# Patient Record
Sex: Male | Born: 1975 | Race: White | Hispanic: No | Smoking: Never smoker
Health system: Southern US, Community
[De-identification: ages and names within clinical notes are randomized; demographics above are authoritative.]

## PROBLEM LIST (undated history)

## (undated) DIAGNOSIS — N289 Disorder of kidney and ureter, unspecified: Secondary | ICD-10-CM

## (undated) DIAGNOSIS — Z87442 Personal history of urinary calculi: Secondary | ICD-10-CM

## (undated) HISTORY — PX: NO PAST SURGERIES: SHX2092

## (undated) HISTORY — PX: LITHOTRIPSY: SUR834

---

## 2001-10-08 ENCOUNTER — Encounter: Payer: Self-pay | Admitting: Emergency Medicine

## 2001-10-08 ENCOUNTER — Emergency Department (HOSPITAL_COMMUNITY): Admission: EM | Admit: 2001-10-08 | Discharge: 2001-10-08 | Payer: Self-pay | Admitting: Emergency Medicine

## 2005-10-26 ENCOUNTER — Emergency Department (HOSPITAL_COMMUNITY): Admission: EM | Admit: 2005-10-26 | Discharge: 2005-10-26 | Payer: Self-pay | Admitting: Emergency Medicine

## 2005-11-05 ENCOUNTER — Encounter: Payer: Self-pay | Admitting: Urology

## 2005-11-05 ENCOUNTER — Ambulatory Visit (HOSPITAL_COMMUNITY): Admission: RE | Admit: 2005-11-05 | Discharge: 2005-11-05 | Payer: Self-pay | Admitting: Urology

## 2013-10-15 ENCOUNTER — Emergency Department (HOSPITAL_COMMUNITY): Payer: BC Managed Care – PPO

## 2013-10-15 ENCOUNTER — Emergency Department (HOSPITAL_COMMUNITY)
Admission: EM | Admit: 2013-10-15 | Discharge: 2013-10-16 | Disposition: A | Discharge status: Home / Self Care | Payer: BC Managed Care – PPO | Attending: Emergency Medicine | Admitting: Emergency Medicine

## 2013-10-15 ENCOUNTER — Encounter (HOSPITAL_COMMUNITY): Payer: Self-pay | Admitting: Emergency Medicine

## 2013-10-15 DIAGNOSIS — N200 Calculus of kidney: Secondary | ICD-10-CM | POA: Diagnosis not present

## 2013-10-15 DIAGNOSIS — R109 Unspecified abdominal pain: Secondary | ICD-10-CM | POA: Diagnosis present

## 2013-10-15 LAB — URINALYSIS, ROUTINE W REFLEX MICROSCOPIC
BILIRUBIN URINE: NEGATIVE
Glucose, UA: NEGATIVE mg/dL
Ketones, ur: NEGATIVE mg/dL
Nitrite: NEGATIVE
Protein, ur: NEGATIVE mg/dL
SPECIFIC GRAVITY, URINE: 1.021 (ref 1.005–1.030)
Urobilinogen, UA: 0.2 mg/dL (ref 0.0–1.0)
pH: 6 (ref 5.0–8.0)

## 2013-10-15 LAB — URINE MICROSCOPIC-ADD ON

## 2013-10-15 MED ORDER — SODIUM CHLORIDE 0.9 % IV SOLN
INTRAVENOUS | Status: DC
  Administered 2013-10-15: 20 mL/h via INTRAVENOUS

## 2013-10-15 MED ORDER — HYDROMORPHONE HCL PF 1 MG/ML IJ SOLN
2.0000 mg | Freq: Once | INTRAMUSCULAR | Status: DC
  Filled 2013-10-15: qty 2

## 2013-10-15 MED ORDER — ONDANSETRON HCL 4 MG/2ML IJ SOLN
4.0000 mg | Freq: Once | INTRAMUSCULAR | Status: DC
  Filled 2013-10-15: qty 2

## 2013-10-15 NOTE — ED Notes (Signed)
Pt. Refused ordered pain med Diluadid IV and Zofran,claimed that he took his pain med prior to coming to ED. Pt. Stated that he "just needs an Xray for his sides." MD notified.

## 2013-10-15 NOTE — ED Provider Notes (Signed)
CSN: [id number]     Arrival date & time 10/15/13  1951 History   First MD Initiated Contact with Patient 10/15/13 2040     Chief Complaint  Patient presents with  . Flank Pain     (Consider location/radiation/quality/duration/timing/severity/associated sxs/prior Treatment) HPI Comments: Patient here with acute onset of left-sided flank pain similar to his prior renal colic. Notes pain in his left flank radiating down to his groin without scrotal edema. No hematuria. No fever or chills. Has used opiates without relief. Last episode of kidney stone was approximately 2 years ago. Symptoms persistent and nothing makes them better worse.  Patient is a 38 y.o. male presenting with flank pain. The history is provided by the patient and the spouse.  Flank Pain    History reviewed. No pertinent past medical history. History reviewed. No pertinent past surgical history. History reviewed. No pertinent family history. History  Substance Use Topics  . Smoking status: Never Smoker   . Smokeless tobacco: Not on file  . Alcohol Use: No    Review of Systems  Genitourinary: Positive for flank pain.  All other systems reviewed and are negative.     Allergies  Review of patient's allergies indicates no known allergies.  Home Medications   Prior to Admission medications   Not on File   BP 143/69  Pulse 84  Resp 16  Ht  (1.676 m)  Wt 230 lb (104.327 kg)  BMI 37.14 kg/m2  SpO2 97% Physical Exam  Nursing note and vitals reviewed. Constitutional: He is oriented to person, place, and time. He appears well-developed and well-nourished.  Non-toxic appearance. No distress.  HENT:  Head: Normocephalic and atraumatic.  Eyes: Conjunctivae, EOM and lids are normal. Pupils are equal, round, and reactive to light.  Neck: Normal range of motion. Neck supple. No tracheal deviation present. No mass present.  Cardiovascular: Normal rate, regular rhythm and normal heart sounds.  Exam reveals no  gallop.   No murmur heard. Pulmonary/Chest: Effort normal and breath sounds normal. No stridor. No respiratory distress. He has no decreased breath sounds. He has no wheezes. He has no rhonchi. He has no rales.  Abdominal: Soft. Normal appearance and bowel sounds are normal. He exhibits no distension. There is no tenderness. There is no rebound and no CVA tenderness.  Musculoskeletal: Normal range of motion. He exhibits no edema and no tenderness.  Neurological: He is alert and oriented to person, place, and time. He has normal strength. No cranial nerve deficit or sensory deficit. GCS eye subscore is 4. GCS verbal subscore is 5. GCS motor subscore is 6.  Skin: Skin is warm and dry. No abrasion and no rash noted.  Psychiatric: He has a normal mood and affect. His speech is normal and behavior is normal.    ED Course  Procedures (including critical care time) Labs Review Labs Reviewed  URINALYSIS, ROUTINE W REFLEX MICROSCOPIC    Imaging Review No results found.   EKG Interpretation None      MDM   Final diagnoses:  None    Patient offered pain medication here and he has deferred. CT results given to patient. Will give urology referral    Toy Baker, MD 10/15/13 2342

## 2013-10-15 NOTE — Discharge Instructions (Signed)

## 2013-10-15 NOTE — ED Notes (Signed)
Pt complains of left flank pain for about two weeks, worse tonight, hx of kidney stones

## 2015-08-12 ENCOUNTER — Other Ambulatory Visit: Payer: Self-pay | Admitting: Urology

## 2015-08-15 ENCOUNTER — Other Ambulatory Visit: Payer: Self-pay | Admitting: Urology

## 2015-08-15 ENCOUNTER — Encounter (HOSPITAL_COMMUNITY): Payer: Self-pay | Admitting: *Deleted

## 2015-08-18 ENCOUNTER — Ambulatory Visit (HOSPITAL_COMMUNITY): Payer: BLUE CROSS/BLUE SHIELD

## 2015-08-18 ENCOUNTER — Ambulatory Visit (HOSPITAL_COMMUNITY)
Admission: RE | Admit: 2015-08-18 | Discharge: 2015-08-18 | Disposition: A | Discharge status: Home / Self Care | Payer: BLUE CROSS/BLUE SHIELD | Source: Ambulatory Visit | Attending: Urology | Admitting: Urology

## 2015-08-18 ENCOUNTER — Encounter (HOSPITAL_COMMUNITY): Payer: Self-pay | Admitting: General Practice

## 2015-08-18 ENCOUNTER — Encounter (HOSPITAL_COMMUNITY)
Admission: RE | Disposition: A | Discharge status: Home / Self Care | Payer: Self-pay | Source: Ambulatory Visit | Attending: Urology

## 2015-08-18 DIAGNOSIS — N201 Calculus of ureter: Secondary | ICD-10-CM | POA: Insufficient documentation

## 2015-08-18 DIAGNOSIS — Z6835 Body mass index (BMI) 35.0-35.9, adult: Secondary | ICD-10-CM | POA: Diagnosis not present

## 2015-08-18 DIAGNOSIS — E669 Obesity, unspecified: Secondary | ICD-10-CM | POA: Insufficient documentation

## 2015-08-18 DIAGNOSIS — F17290 Nicotine dependence, other tobacco product, uncomplicated: Secondary | ICD-10-CM | POA: Insufficient documentation

## 2015-08-18 HISTORY — DX: Personal history of urinary calculi: Z87.442

## 2015-08-18 SURGERY — LITHOTRIPSY, ESWL
Anesthesia: LOCAL | Laterality: Right

## 2015-08-18 MED ORDER — DIAZEPAM 5 MG PO TABS
10.0000 mg | ORAL_TABLET | ORAL | Status: AC
  Administered 2015-08-18: 10 mg via ORAL
  Filled 2015-08-18: qty 2

## 2015-08-18 MED ORDER — CIPROFLOXACIN HCL 500 MG PO TABS
500.0000 mg | ORAL_TABLET | ORAL | Status: AC
  Administered 2015-08-18: 500 mg via ORAL
  Filled 2015-08-18: qty 1

## 2015-08-18 MED ORDER — DIPHENHYDRAMINE HCL 25 MG PO CAPS
25.0000 mg | ORAL_CAPSULE | ORAL | Status: AC
  Administered 2015-08-18: 25 mg via ORAL
  Filled 2015-08-18: qty 1

## 2015-08-18 MED ORDER — SODIUM CHLORIDE 0.9 % IV SOLN
INTRAVENOUS | Status: DC
  Administered 2015-08-18: 08:00:00 via INTRAVENOUS

## 2017-06-12 IMAGING — CR DG ABDOMEN 1V
2 series · 2 of 2 positions shown · non-contrast
Comparison: KUB August 11, 2015

CLINICAL DATA: Preoperative exam prior to kidney stone removal
procedure, right-sided abdominal discomfort today.

EXAM:
ABDOMEN - 1 VIEW

[t abdomen supine (1 of 2)]
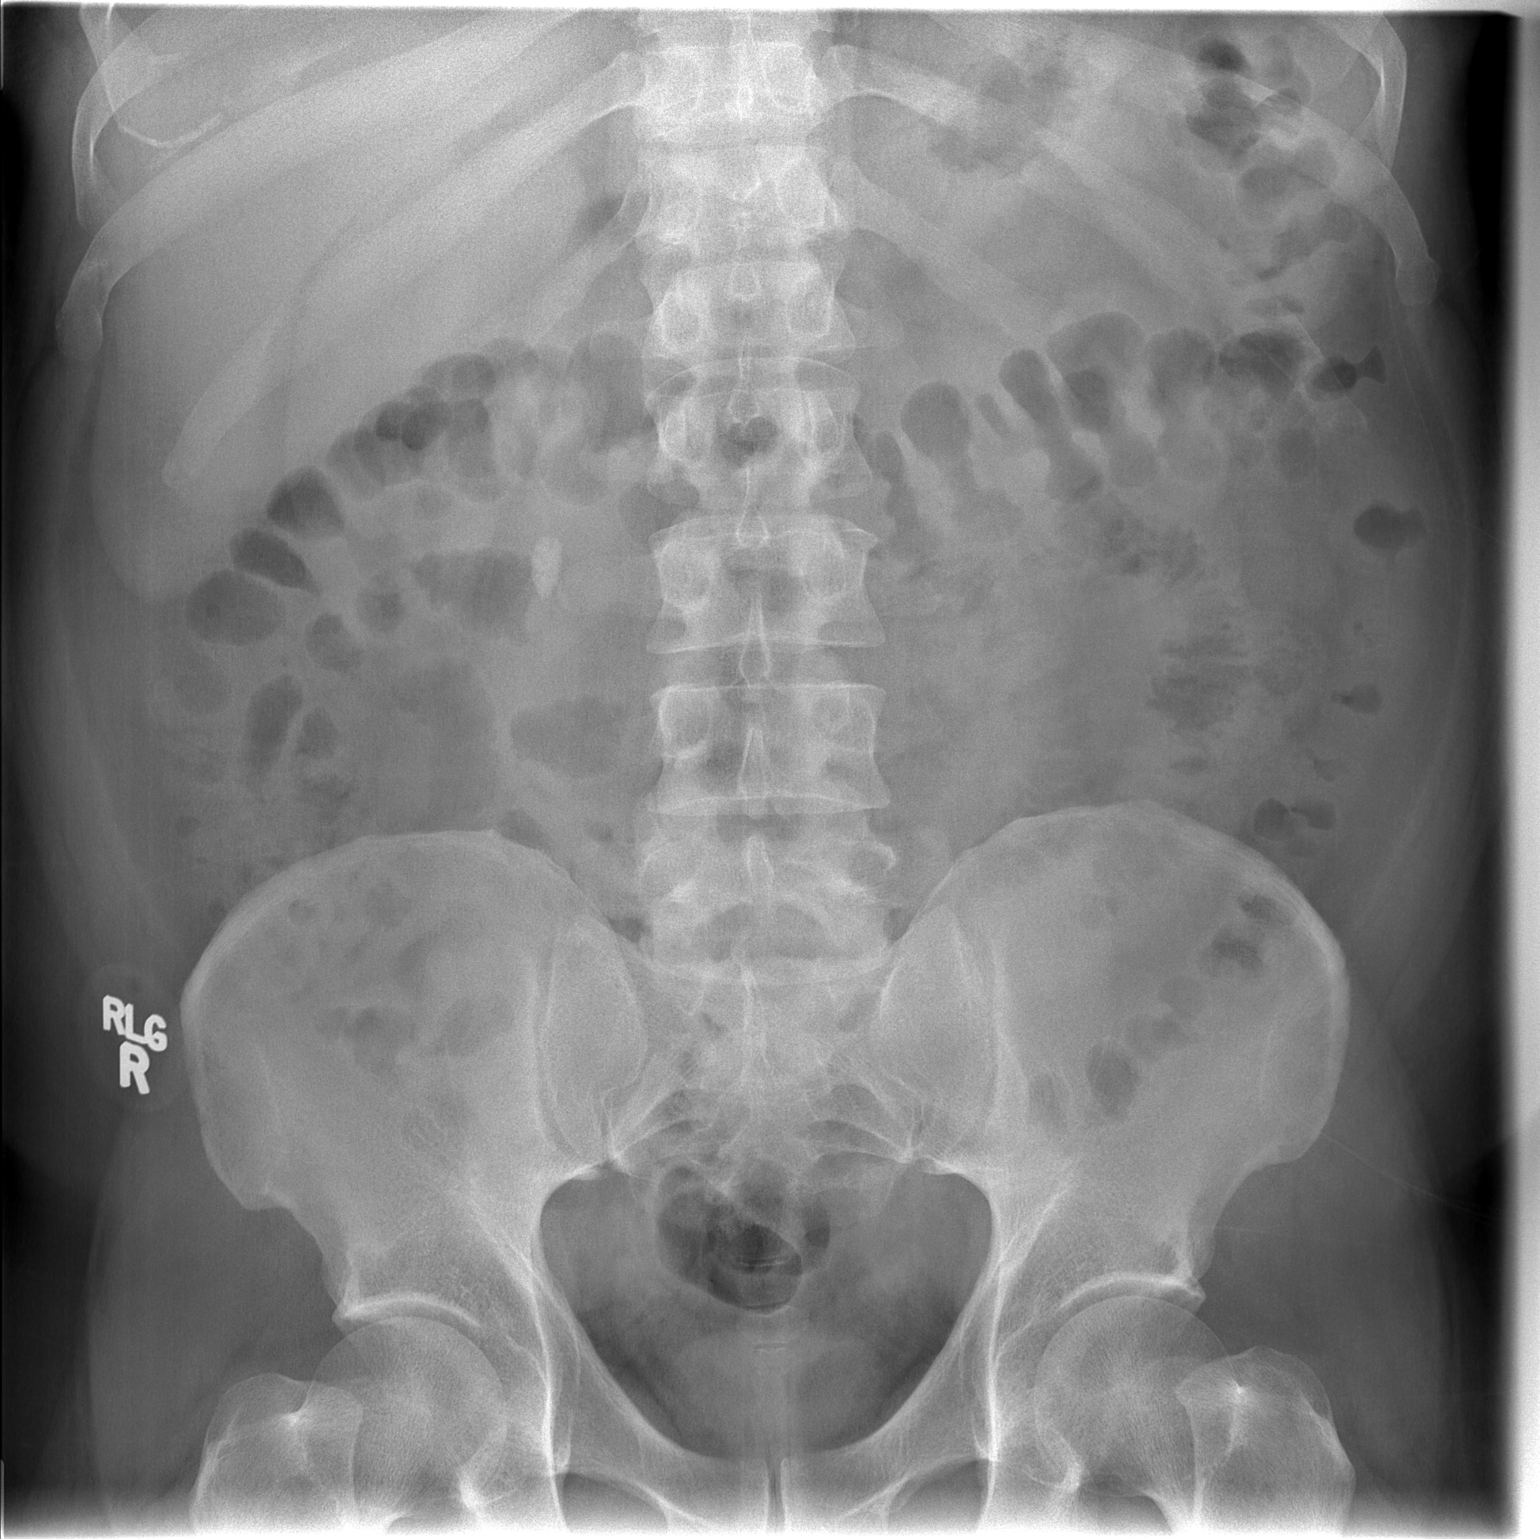

[t abdomen supine (2 of 2)]
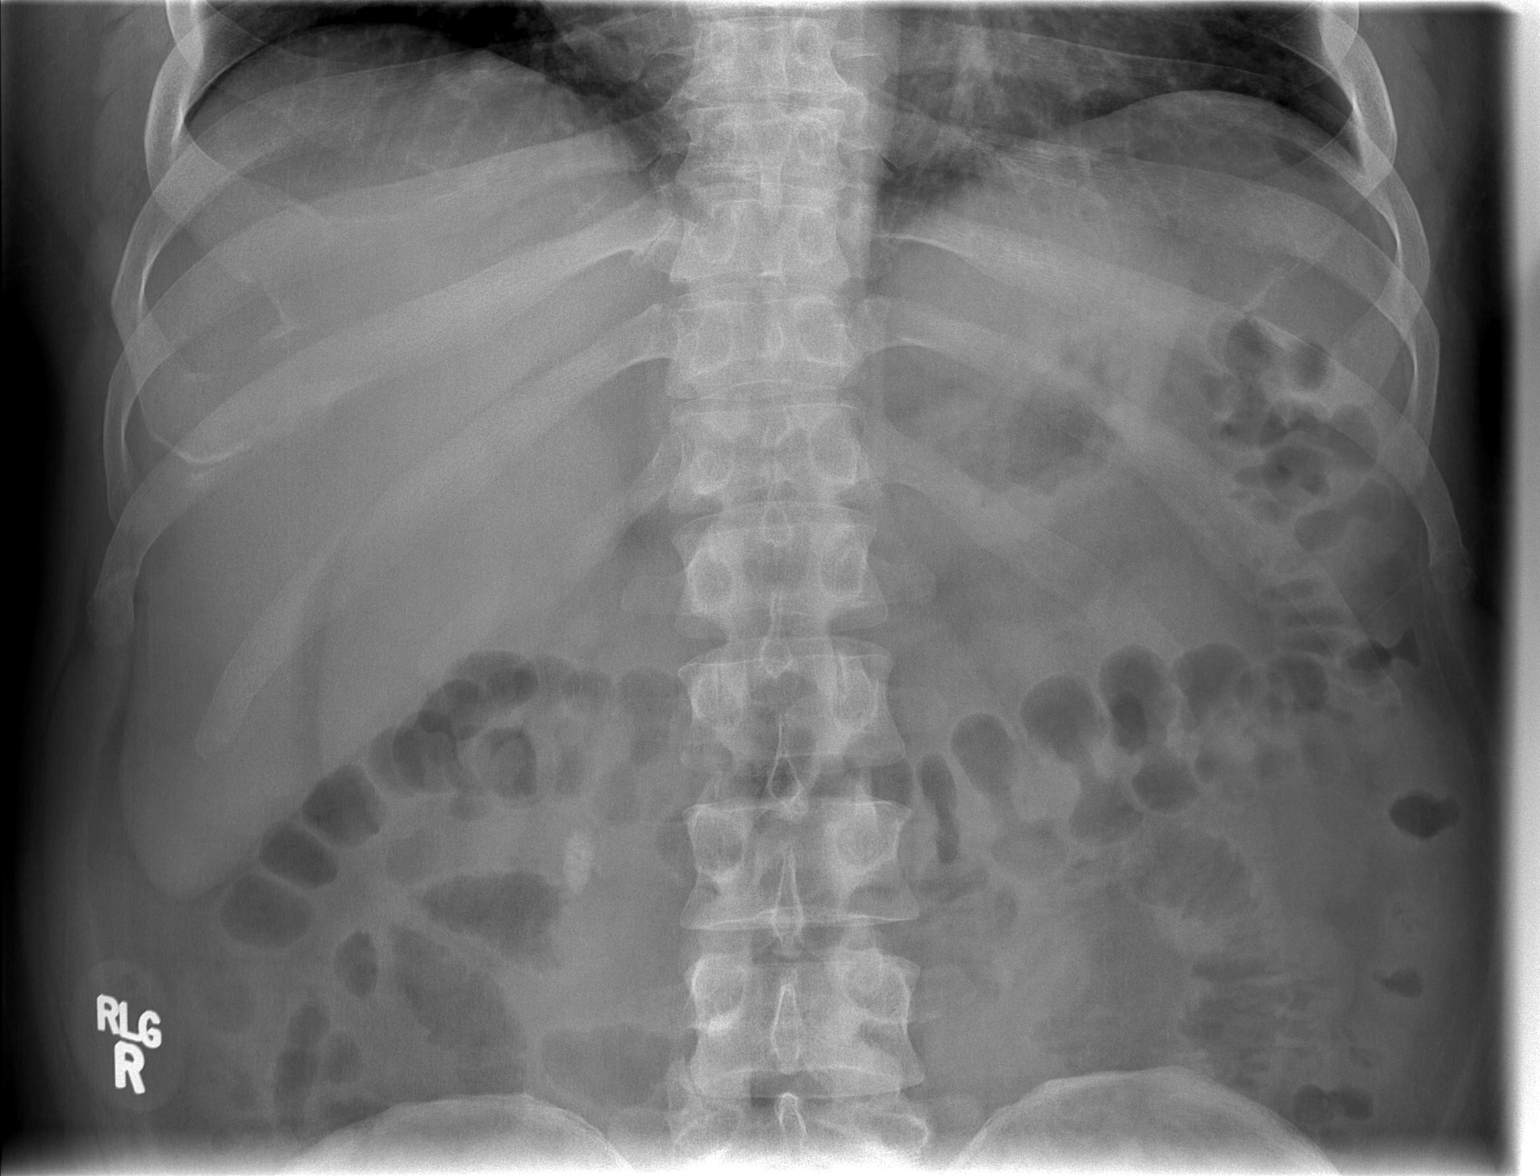

[2 of 2 positions shown; findings below may reference images not displayed]

FINDINGS: There is a persistent ovoid 1.8 x 0.8 cm stone at the right
ureterovesical junction. No definite stones are observed elsewhere.
The bowel gas pattern is normal. The bony structures are
unremarkable.
IMPRESSION: Persistent large right ureterovesical junction stone.

## 2019-04-09 ENCOUNTER — Other Ambulatory Visit: Payer: Self-pay

## 2019-04-09 DIAGNOSIS — N3091 Cystitis, unspecified with hematuria: Secondary | ICD-10-CM | POA: Insufficient documentation

## 2019-04-09 DIAGNOSIS — R109 Unspecified abdominal pain: Secondary | ICD-10-CM | POA: Diagnosis present

## 2019-04-10 ENCOUNTER — Encounter (HOSPITAL_COMMUNITY): Payer: Self-pay

## 2019-04-10 ENCOUNTER — Other Ambulatory Visit: Payer: Self-pay

## 2019-04-10 ENCOUNTER — Emergency Department (HOSPITAL_COMMUNITY): Payer: 59

## 2019-04-10 ENCOUNTER — Emergency Department (HOSPITAL_COMMUNITY)
Admission: EM | Admit: 2019-04-10 | Discharge: 2019-04-10 | Disposition: A | Discharge status: Home / Self Care | Payer: 59 | Attending: Emergency Medicine | Admitting: Emergency Medicine

## 2019-04-10 DIAGNOSIS — R109 Unspecified abdominal pain: Secondary | ICD-10-CM

## 2019-04-10 DIAGNOSIS — N390 Urinary tract infection, site not specified: Secondary | ICD-10-CM

## 2019-04-10 LAB — URINALYSIS, ROUTINE W REFLEX MICROSCOPIC
Bilirubin Urine: NEGATIVE
Glucose, UA: NEGATIVE mg/dL
Ketones, ur: NEGATIVE mg/dL
Leukocytes,Ua: NEGATIVE
Nitrite: NEGATIVE
Protein, ur: NEGATIVE mg/dL
Specific Gravity, Urine: 1.026 (ref 1.005–1.030)
pH: 5 (ref 5.0–8.0)

## 2019-04-10 LAB — CBC
HCT: 43.4 % (ref 39.0–52.0)
Hemoglobin: 14.7 g/dL (ref 13.0–17.0)
MCH: 30.3 pg (ref 26.0–34.0)
MCHC: 33.9 g/dL (ref 30.0–36.0)
MCV: 89.5 fL (ref 80.0–100.0)
Platelets: 366 10*3/uL (ref 150–400)
RBC: 4.85 MIL/uL (ref 4.22–5.81)
RDW: 12.5 % (ref 11.5–15.5)
WBC: 10.2 10*3/uL (ref 4.0–10.5)
nRBC: 0 % (ref 0.0–0.2)

## 2019-04-10 LAB — BASIC METABOLIC PANEL
Anion gap: 12 (ref 5–15)
BUN: 16 mg/dL (ref 6–20)
CO2: 26 mmol/L (ref 22–32)
Calcium: 9.5 mg/dL (ref 8.9–10.3)
Chloride: 102 mmol/L (ref 98–111)
Creatinine, Ser: 1.2 mg/dL (ref 0.61–1.24)
GFR calc Af Amer: >60 mL/min (ref >60)
GFR calc non Af Amer: >60 mL/min (ref >60)
Glucose, Bld: 106 mg/dL — ABNORMAL HIGH (ref 70–99)
Potassium: 3.8 mmol/L (ref 3.5–5.1)
Sodium: 140 mmol/L (ref 135–145)

## 2019-04-10 NOTE — ED Provider Notes (Signed)
[facility] EMERGENCY DEPARTMENT Provider Note   CSN: [id number] Arrival date & time: 04/09/19  2349   Time seen 2:45 AM   History Chief Complaint  Patient presents with  . Flank Pain    Ronald Palmer is a 44 y.o. male.  HPI   Patient states about 2 weeks ago he started having left lower back pain that he states sometimes radiates into his left abdomen.  He states he is having dysuria, some urinary difficulty and sometimes his stream will stop or he has dribbling.  He states he has been seeing blood in his urine.  He denies nausea, vomiting, or fever.  He states if he drinks a lot of water his pain gets worse, nothing makes it feel better.  When asked to describe his discomfort he states "my kidneys do not feel good".  He denies having pain.  He states he has been dealing of kidney stones "all my life".  He states he had lithotripsy 4 years ago.  He states he has not seen a urologist since that time.  Evidently he went to urgent care and he was diagnosed with prostatitis and was treated with Cipro and Flomax.  He states he wants to know "how big the stone is antifungal be able to pass it".  PCP System, Pcp Not In   Past Medical History:  Diagnosis Date  . History of kidney stones     There are no problems to display for this patient.   Past Surgical History:  Procedure Laterality Date  . NO PAST SURGERIES         No family history on file.  Social History   Tobacco Use  . Smoking status: Never Smoker  . Smokeless tobacco: Current User    Types: Chew  Substance Use Topics  . Alcohol use: No  . Drug use: Never    Home Medications Prior to Admission medications   Medication Sig Start Date End Date Taking? Authorizing Provider  HYDROcodone-acetaminophen (NORCO) 10-325 MG per tablet Take 1-2 tablets by mouth every 6 (six) hours as needed for moderate pain.    [provider]  tamsulosin (FLOMAX) 0.4 MG CAPS capsule Take 0.4 mg by mouth 2 (two) times daily.     [provider]  traMADol (ULTRAM) 50 MG tablet Take 50 mg by mouth every 6 (six) hours as needed for moderate pain.    [provider]    Allergies    Patient has no known allergies.  Review of Systems   Review of Systems  All other systems reviewed and are negative.   Physical Exam Updated Vital Signs BP 134/79 (BP Location: Right Arm)   Pulse 93   Temp 98.4 F (36.9 C) (Oral)   Resp 18   Ht 5\' 6"  (1.676 m)   Wt 123.8 kg   SpO2 98%   BMI 44.06 kg/m   Physical Exam Vitals and nursing note reviewed.  Constitutional:      General: He is not in acute distress.    Appearance: Normal appearance. He is well-developed. He is obese. He is not ill-appearing or toxic-appearing.     Comments: Watching TV in no distress  HENT:     Head: Normocephalic and atraumatic.     Right Ear: External ear normal.     Left Ear: External ear normal.     Nose: Nose normal. No mucosal edema or rhinorrhea.     Mouth/Throat:     Dentition: No dental abscesses.  Pharynx: No uvula swelling.  Eyes:     Extraocular Movements: Extraocular movements intact.     Conjunctiva/sclera: Conjunctivae normal.     Pupils: Pupils are equal, round, and reactive to light.  Cardiovascular:     Rate and Rhythm: Normal rate and regular rhythm.     Heart sounds: Normal heart sounds. No murmur. No friction rub. No gallop.   Pulmonary:     Effort: Pulmonary effort is normal. No respiratory distress.     Breath sounds: Normal breath sounds. No wheezing, rhonchi or rales.  Chest:     Chest wall: No tenderness or crepitus.  Abdominal:     General: Bowel sounds are normal. There is no distension.     Palpations: Abdomen is soft.     Tenderness: There is no abdominal tenderness. There is no right CVA tenderness, left CVA tenderness, guarding or rebound.  Musculoskeletal:        General: No tenderness. Normal range of motion.     Cervical back: Full passive range of motion without pain, normal  range of motion and neck supple.       Back:     Comments: Moves all extremities well.  Area of his discomfort is noted  Skin:    General: Skin is warm and dry.     Coloration: Skin is not pale.     Findings: No erythema or rash.  Neurological:     Mental Status: He is alert and oriented to person, place, and time.     Cranial Nerves: No cranial nerve deficit.  Psychiatric:        Mood and Affect: Mood is not anxious.        Speech: Speech normal.        Behavior: Behavior normal.     ED Results / Procedures / Treatments   Labs (all labs ordered are listed, but only abnormal results are displayed) Results for orders placed or performed during the hospital encounter of 04/10/19  Urinalysis, Routine w reflex microscopic- may I&O cath if menses  Result Value Ref Range   Color, Urine YELLOW YELLOW   APPearance CLEAR CLEAR   Specific Gravity, Urine 1.026 1.005 - 1.030   pH 5.0 5.0 - 8.0   Glucose, UA NEGATIVE NEGATIVE mg/dL   Hgb urine dipstick SMALL (A) NEGATIVE   Bilirubin Urine NEGATIVE NEGATIVE   Ketones, ur NEGATIVE NEGATIVE mg/dL   Protein, ur NEGATIVE NEGATIVE mg/dL   Nitrite NEGATIVE NEGATIVE   Leukocytes,Ua NEGATIVE NEGATIVE   RBC / HPF 21-50 0 - 5 RBC/hpf   WBC, UA 6-10 0 - 5 WBC/hpf   Bacteria, UA RARE (A) NONE SEEN   Mucus PRESENT    Ca Oxalate Crys, UA PRESENT   Basic metabolic panel  Result Value Ref Range   Sodium 140 135 - 145 mmol/L   Potassium 3.8 3.5 - 5.1 mmol/L   Chloride 102 98 - 111 mmol/L   CO2 26 22 - 32 mmol/L   Glucose, Bld 106 (H) 70 - 99 mg/dL   BUN 16 6 - 20 mg/dL   Creatinine, Ser 1.20 0.61 - 1.24 mg/dL   Calcium 9.5 8.9 - 10.3 mg/dL   GFR calc non Af Amer >60 >60 mL/min   GFR calc Af Amer >60 >60 mL/min   Anion gap 12 5 - 15  CBC  Result Value Ref Range   WBC 10.2 4.0 - 10.5 K/uL   RBC 4.85 4.22 - 5.81 MIL/uL   Hemoglobin 14.7 13.0 -  17.0 g/dL   HCT 70.6 23.7 - 62.8 %   MCV 89.5 80.0 - 100.0 fL   MCH 30.3 26.0 - 34.0 pg   MCHC  33.9 30.0 - 36.0 g/dL   RDW 31.5 17.6 - 16.0 %   Platelets 366 150 - 400 K/uL   nRBC 0.0 0.0 - 0.2 %   Laboratory interpretation all normal except prob UTI    EKG None  Radiology CT Renal Stone Study  Result Date: 04/10/2019 CLINICAL DATA:  Flank pain beginning 2 weeks ago. History of stone disease. EXAM: CT ABDOMEN AND PELVIS WITHOUT CONTRAST TECHNIQUE: Multidetector CT imaging of the abdomen and pelvis was performed following the standard protocol without IV contrast. COMPARISON:  10/15/2013 FINDINGS: Lower chest: Normal Hepatobiliary: Diffuse fatty change of the liver. No focal lesion. No calcified gallstones. Pancreas: Normal Spleen: Normal Adrenals/Urinary Tract: Adrenal glands are normal. Renal parenchyma is normal. Two 1-2 mm nonobstructing stones in the left kidney. No other stone. No cyst, massa or hydronephrosis. No stone along the course of either ureter. No stone in the bladder. Stomach/Bowel: Normal.  Normal appendix. Vascular/Lymphatic: Normal Reproductive: Normal Other: No free fluid or air. Musculoskeletal: Normal IMPRESSION: Fatty liver, which could be symptomatic. No significant urinary tract finding today. Normal appearance except for two 1-2 mm nonobstructing stones in the left kidney. No evidence of passing stone or hydronephrosis. Electronically Signed   By: Paulina Fusi M.D.   On: 04/10/2019 03:27    Procedures Procedures (including critical care time)  Medications Ordered in ED Medications - No data to display  ED Course  I have reviewed the triage vital signs and the nursing notes.  Pertinent labs & imaging results that were available during my care of the patient were reviewed by me and considered in my medical decision making (see chart for details).    MDM Rules/Calculators/A&P                      Pt refused pain medication, he seems very comfortable. CT renal done.   3:50 AM patient was given his CT results.  He was seen in urgent care yesterday and  was started on Cipro and Flomax.  He has a lot of questions, he will be referred to [facility] urology.  He was encouraged to finish his antibiotics.  Final Clinical Impression(s) / ED Diagnoses Final diagnoses:  Left flank pain  Urinary tract infection with hematuria, site unspecified    Rx / DC Orders ED Discharge Orders    None     Plan discharge  Devoria Albe, MD, Concha Pyo, MD 04/10/19 [phone]

## 2019-04-10 NOTE — ED Triage Notes (Signed)
Pt reports flank pain that started 2 weeks ago, pt seen at urgent care yesterday and dx with prostatitis and kidney stone. Rx include cipro and flomax- pt also given ibuprofen- pt says he hasn't needed to take it, not "hurting bad enough to take it yet." pt says he wants to find out how big the stone is, because last time he had a kidney stone it was so big he could not pass it. Pt says he has decreased urine output, even when taking flomax. Pt says he is urinating very small amount.

## 2019-04-10 NOTE — Discharge Instructions (Addendum)
Drink plenty of fluids.  Take your antibiotics until gone.  If you are not improving over the next several days, call [facility] to be evaluated.  Return to the emergency department if you get fever, vomiting, or severe pain in years flank.

## 2019-04-28 ENCOUNTER — Encounter: Payer: Self-pay | Admitting: Family Medicine

## 2019-04-28 ENCOUNTER — Ambulatory Visit (INDEPENDENT_AMBULATORY_CARE_PROVIDER_SITE_OTHER): Payer: 59 | Admitting: Family Medicine

## 2019-04-28 DIAGNOSIS — N400 Enlarged prostate without lower urinary tract symptoms: Secondary | ICD-10-CM

## 2019-04-28 DIAGNOSIS — N200 Calculus of kidney: Secondary | ICD-10-CM

## 2019-04-28 DIAGNOSIS — Z13220 Encounter for screening for lipoid disorders: Secondary | ICD-10-CM | POA: Diagnosis not present

## 2019-04-28 MED ORDER — TAMSULOSIN HCL 0.4 MG PO CAPS: 0.4000 mg | ORAL_CAPSULE | Freq: Every day | ORAL | 2 refills | Status: DC

## 2019-04-28 NOTE — Progress Notes (Signed)
   Virtual Visit via Telephone Note  I connected with Tilman Neat on 05/03/19 at 5:01 PM by telephone and verified that I am speaking with the correct person using two identifiers. Clance Baquero is currently located at home and nobody is currently with him during this visit. The provider, Loman Brooklyn, FNP is located in their office at time of visit.  I discussed the limitations, risks, security and privacy concerns of performing an evaluation and management service by telephone and the availability of in person appointments. I also discussed with the patient that there may be a patient responsible charge related to this service. The patient expressed understanding and agreed to proceed.  Subjective: PCP: System, Pcp Not In  Chief Complaint  Patient presents with  . Wineglass presents to establish care.  He does not have a previous PCP from which he is transferring care as he has not been going to the doctor.   Patient was in the hospital recently with kidney stones.  He is currently still taking the Flomax.  He has not been taking any pain medication.  He reports that last night he was not urinating but he was today as he passed the stone this morning.   ROS: Per HPI  Current Outpatient Medications:  .  ciprofloxacin (CIPRO) 500 MG tablet, Take 1 tablet (500 mg total) by mouth 2 (two) times daily., Disp: 14 tablet, Rfl: 0 .  Multiple Vitamin (MULTIVITAMIN WITH MINERALS) TABS tablet, Take 1 tablet by mouth daily., Disp: , Rfl:  .  tamsulosin (FLOMAX) 0.4 MG CAPS capsule, Take 1 capsule (0.4 mg total) by mouth daily., Disp: 30 capsule, Rfl: 2  No Known Allergies Past Medical History:  Diagnosis Date  . History of kidney stones   . Renal disorder     Observations/Objective: A&O  No respiratory distress or wheezing audible over the phone Mood, judgement, and thought processes all WNL   Assessment and Plan: 1. Enlarged prostate - PSA, total and free; Future  2.  Kidney stones - tamsulosin (FLOMAX) 0.4 MG CAPS capsule; Take 1 capsule (0.4 mg total) by mouth daily.  Dispense: 30 capsule; Refill: 2 - CBC with Differential/Platelet; Future - CMP14+EGFR; Future  3. Screening for lipid disorders - Lipid panel; Future  Patient will come in for lab work.   Follow Up Instructions:  I discussed the assessment and treatment plan with the patient. The patient was provided an opportunity to ask questions and all were answered. The patient agreed with the plan and demonstrated an understanding of the instructions.   The patient was advised to call back or seek an in-person evaluation if the symptoms worsen or if the condition fails to improve as anticipated.  The above assessment and management plan was discussed with the patient. The patient verbalized understanding of and has agreed to the management plan. Patient is aware to call the clinic if symptoms persist or worsen. Patient is aware when to return to the clinic for a follow-up visit. Patient educated on when it is appropriate to go to the emergency department.   Time call ended: 5:17 PM  I provided 18 minutes of non-face-to-face time during this encounter.  Hendricks Limes, MSN, APRN, FNP-C [facility] 05/03/19

## 2019-04-29 ENCOUNTER — Other Ambulatory Visit: Payer: Self-pay

## 2019-04-29 ENCOUNTER — Emergency Department (HOSPITAL_COMMUNITY)
Admission: EM | Admit: 2019-04-29 | Discharge: 2019-04-29 | Disposition: A | Discharge status: Home / Self Care | Payer: 59 | Attending: Emergency Medicine | Admitting: Emergency Medicine

## 2019-04-29 ENCOUNTER — Emergency Department (HOSPITAL_COMMUNITY): Payer: 59

## 2019-04-29 ENCOUNTER — Encounter (HOSPITAL_COMMUNITY): Payer: Self-pay | Admitting: Emergency Medicine

## 2019-04-29 DIAGNOSIS — R102 Pelvic and perineal pain: Secondary | ICD-10-CM | POA: Diagnosis not present

## 2019-04-29 DIAGNOSIS — Z79899 Other long term (current) drug therapy: Secondary | ICD-10-CM | POA: Insufficient documentation

## 2019-04-29 DIAGNOSIS — R339 Retention of urine, unspecified: Secondary | ICD-10-CM | POA: Insufficient documentation

## 2019-04-29 DIAGNOSIS — R300 Dysuria: Secondary | ICD-10-CM | POA: Insufficient documentation

## 2019-04-29 HISTORY — DX: Disorder of kidney and ureter, unspecified: N28.9

## 2019-04-29 LAB — CBC
HCT: 42.7 % (ref 39.0–52.0)
Hemoglobin: 14.8 g/dL (ref 13.0–17.0)
MCH: 30.7 pg (ref 26.0–34.0)
MCHC: 34.7 g/dL (ref 30.0–36.0)
MCV: 88.6 fL (ref 80.0–100.0)
Platelets: 348 10*3/uL (ref 150–400)
RBC: 4.82 MIL/uL (ref 4.22–5.81)
RDW: 12.5 % (ref 11.5–15.5)
WBC: 9.3 10*3/uL (ref 4.0–10.5)
nRBC: 0 % (ref 0.0–0.2)

## 2019-04-29 LAB — BASIC METABOLIC PANEL
Anion gap: 10 (ref 5–15)
BUN: 12 mg/dL (ref 6–20)
CO2: 24 mmol/L (ref 22–32)
Calcium: 9.4 mg/dL (ref 8.9–10.3)
Chloride: 105 mmol/L (ref 98–111)
Creatinine, Ser: 1.06 mg/dL (ref 0.61–1.24)
GFR calc Af Amer: >60 mL/min (ref >60)
GFR calc non Af Amer: >60 mL/min (ref >60)
Glucose, Bld: 116 mg/dL — ABNORMAL HIGH (ref 70–99)
Potassium: 3.9 mmol/L (ref 3.5–5.1)
Sodium: 139 mmol/L (ref 135–145)

## 2019-04-29 LAB — URINALYSIS, ROUTINE W REFLEX MICROSCOPIC
Bilirubin Urine: NEGATIVE
Glucose, UA: NEGATIVE mg/dL
Ketones, ur: NEGATIVE mg/dL
Leukocytes,Ua: NEGATIVE
Nitrite: NEGATIVE
Protein, ur: NEGATIVE mg/dL
Specific Gravity, Urine: 1.021 (ref 1.005–1.030)
pH: 5 (ref 5.0–8.0)

## 2019-04-29 MED ORDER — SODIUM CHLORIDE 0.9 % IV BOLUS: 1000.0000 mL | Freq: Once | INTRAVENOUS | Status: DC

## 2019-04-29 MED ORDER — CIPROFLOXACIN HCL 500 MG PO TABS: 500.0000 mg | ORAL_TABLET | Freq: Two times a day (BID) | ORAL | 0 refills | Status: DC

## 2019-04-29 NOTE — Discharge Instructions (Signed)
As discussed, your evaluation today has been largely reassuring.  But, it is important that you monitor your condition carefully, and do not hesitate to return to the ED if you develop new, or concerning changes in your condition. ? ?Otherwise, please follow-up with your physician for appropriate ongoing care. ? ?

## 2019-04-29 NOTE — ED Triage Notes (Signed)
Per pt, states he has a kidney stone in his ureter which is causing a blockage-hasn't urinated since 0630 this morning

## 2019-04-29 NOTE — ED Provider Notes (Signed)
[name]-EMERGENCY DEPT Provider Note   CSN: [id number] Arrival date & time: 04/29/19  0844     History Chief Complaint  Patient presents with  . Urinary Retention    Ronald Palmer is a 44 y.o. male.  HPI   Patient with a history of kidney stones presents with concern of pain in the suprapubic region, decreased urine output.  Patient states that he is generally well though does have a history of stones, including diagnosis within the past 2 weeks at another healthcare facility of left-sided kidney stones via CT scan.  He states that he was also diagnosed with prostatitis.  Since that evaluation has been taking ciprofloxacin, and Flomax as well as anti-inflammatories.  He was generally well until about 5 hours ago when he noticed inability to urinate.  Since that time he has had no urine production has had pain in the suprapubic region.  No flank pain, no fever, no vomiting, no clear alleviating or exacerbating factors, including medication attempts. After describing his inability to urinate to his sister who is a Advice worker he was referred here for evaluation.  Past Medical History:  Diagnosis Date  . History of kidney stones   . Renal disorder     There are no problems to display for this patient.   Past Surgical History:  Procedure Laterality Date  . LITHOTRIPSY         Family History  Problem Relation Age of Onset  . Other Mother        Bone disease  . Dementia Father   . Heart disease Maternal Grandmother   . Rheum arthritis Maternal Grandmother   . Heart attack Maternal Grandfather   . Alcohol abuse Paternal Grandfather   . Dementia Paternal Grandfather     Social History   Tobacco Use  . Smoking status: Never Smoker  . Smokeless tobacco: Current User    Types: Chew  Substance Use Topics  . Alcohol use: No  . Drug use: Never    Home Medications Prior to Admission medications   Medication Sig Start Date End Date Taking?  Authorizing Provider  Multiple Vitamin (MULTIVITAMIN WITH MINERALS) TABS tablet Take 1 tablet by mouth daily.   Yes [provider]  tamsulosin (FLOMAX) 0.4 MG CAPS capsule Take 1 capsule (0.4 mg total) by mouth daily. 04/28/19  Yes Gwenlyn Fudge, FNP    Allergies    Patient has no known allergies.  Review of Systems   Review of Systems  Constitutional:       Per HPI, otherwise negative  HENT:       Per HPI, otherwise negative  Respiratory:       Per HPI, otherwise negative  Cardiovascular:       Per HPI, otherwise negative  Gastrointestinal: Negative for vomiting.  Endocrine:       Negative aside from HPI  Genitourinary:       Neg aside from HPI   Musculoskeletal:       Per HPI, otherwise negative  Skin: Negative.   Neurological: Negative for syncope.    Physical Exam Updated Vital Signs BP 134/78   Pulse 63   Temp 98.1 F (36.7 C) (Oral)   Resp 16   SpO2 97%   Physical Exam Vitals and nursing note reviewed.  Constitutional:      General: He is not in acute distress.    Appearance: He is well-developed. He is obese.  HENT:     Head: Normocephalic and  atraumatic.  Eyes:     Conjunctiva/sclera: Conjunctivae normal.  Cardiovascular:     Rate and Rhythm: Normal rate and regular rhythm.  Pulmonary:     Effort: Pulmonary effort is normal. No respiratory distress.     Breath sounds: No stridor.  Abdominal:     General: There is no distension.     Tenderness: There is no abdominal tenderness. There is no guarding.  Skin:    General: Skin is warm and dry.  Neurological:     Mental Status: He is alert and oriented to person, place, and time.     ED Results / Procedures / Treatments   Labs (all labs ordered are listed, but only abnormal results are displayed) Labs Reviewed  URINALYSIS, ROUTINE W REFLEX MICROSCOPIC - Abnormal; Notable for the following components:      Result Value   Hgb urine dipstick MODERATE (*)    Bacteria, UA RARE (*)    All  other components within normal limits  BASIC METABOLIC PANEL - Abnormal; Notable for the following components:   Glucose, Bld 116 (*)    All other components within normal limits  CBC    EKG None  Radiology US Renal  Result Date: 04/29/2019 CLINICAL DATA:  Difficulty urinating today. History of urinary tract stones. EXAM: RENAL / URINARY TRACT ULTRASOUND COMPLETE COMPARISON:  CT abdomen and pelvis 04/10/2019. FINDINGS: Right Kidney: Renal measurements: 12.6 x 5.0 x 5.7 cm = volume: 186.7 mL . Echogenicity within normal limits. No mass or hydronephrosis visualized. Left Kidney: Renal measurements: 12.1 x 6.7 x 5.9 cm = volume: 249.1 mL. Echogenicity within normal limits. No mass or hydronephrosis visualized. Bladder: Appears normal for degree of bladder distention. Other: Fatty infiltration of the liver noted. IMPRESSION: Two punctate stones in the left kidney seen on prior CT are difficult to visualize on this examination. Negative for hydronephrosis. Fatty infiltration of the liver. Electronically Signed   By: Inge Rise M.D.   On: 04/29/2019 10:48    Procedures Procedures (including critical care time)  Medications Ordered in ED Medications - No data to display  ED Course  I have reviewed the triage vital signs and the nursing notes.  Pertinent labs & imaging results that were available during my care of the patient were reviewed by me and considered in my medical decision making (see chart for details).    MDM Rules/Calculators/A&P                      1:36 PM Patient awake, alert, sitting upright on the edge of the bed.  I reviewed ultrasound results, urinalysis, labs with him again.  No evidence for bacteremia, sepsis, no evidence for hydronephrosis or obstruction.  I reviewed CT scan from outside hospital and that is notable for 2 punctate stones, no stones of substantial dimension likely to cause obstruction; similar to today's ultrasound. He continues to be able to  produce urine, as above has no evidence of bacteremia, sepsis.  With his prior diagnosis of prostatitis, some of his discomfort may be secondary to this, and he will continue antibiotics.  He has an outpatient follow-up visit scheduled for within the next week, was encouraged to keep that appointment or to follow-up with her urologist if he is unable to do so. Final Clinical Impression(s) / ED Diagnoses Final diagnoses:  Dysuria     Carmin Muskrat, MD 04/29/19 1339

## 2019-04-29 NOTE — ED Triage Notes (Signed)
Pt states unable to urinate this morning, Hx of Kidney stones.

## 2019-04-29 NOTE — ED Notes (Signed)
Bladder scan by [place] tech states 

## 2019-05-03 ENCOUNTER — Encounter: Payer: Self-pay | Admitting: Family Medicine

## 2019-05-07 ENCOUNTER — Encounter: Payer: Self-pay | Admitting: Family Medicine

## 2019-05-07 ENCOUNTER — Ambulatory Visit (INDEPENDENT_AMBULATORY_CARE_PROVIDER_SITE_OTHER): Payer: 59 | Admitting: Family Medicine

## 2019-05-07 DIAGNOSIS — N410 Acute prostatitis: Secondary | ICD-10-CM

## 2019-05-07 DIAGNOSIS — R339 Retention of urine, unspecified: Secondary | ICD-10-CM | POA: Diagnosis not present

## 2019-05-07 DIAGNOSIS — N200 Calculus of kidney: Secondary | ICD-10-CM | POA: Diagnosis not present

## 2019-05-07 MED ORDER — CIPROFLOXACIN HCL 500 MG PO TABS: 500.0000 mg | ORAL_TABLET | Freq: Two times a day (BID) | ORAL | 0 refills | Status: DC

## 2019-05-07 NOTE — Progress Notes (Signed)
Virtual Visit via Telephone Note  I connected with Ronald Palmer on 05/07/19 at 10:36 AM by telephone and verified that I am speaking with the correct person using two identifiers. Ronald Palmer is currently located at work and nobody is currently with him during this visit. The provider, Loman Brooklyn, FNP is located in their home at time of visit.  I discussed the limitations, risks, security and privacy concerns of performing an evaluation and management service by telephone and the availability of in person appointments. I also discussed with the patient that there may be a patient responsible charge related to this service. The patient expressed understanding and agreed to proceed.  Subjective: PCP: Loman Brooklyn, FNP  Chief Complaint  Patient presents with  . Hospitalization Follow-up   The patient was seen at [facility] ER on 04/29/2019 due to urinary retention.  Patient had lab work, urinalysis, and an ultrasound completed.  There was no evidence for bacteremia, sepsis, hydronephrosis or obstruction.  They felt with his prior diagnosis of prostatitis that some of his discomfort was secondary to this and advised that he continue antibiotic.  Patient reports he took his last Cipro last night.  He has completed a 2-week course of antibiotics.    Patient reports at times he is able to pee "good" but then other times it just trickles out.  He feels he is passing another stone as he is having groin pain and urinating blood yesterday.  He has already passed 3 stones in the past 2 weeks.  He is not established with a urologist.  He is very nervous as he reports a family history of prostate cancer in either his uncle or grandfather.    ROS: Per HPI  Current Outpatient Medications:  .  ciprofloxacin (CIPRO) 500 MG tablet, Take 1 tablet (500 mg total) by mouth 2 (two) times daily., Disp: 14 tablet, Rfl: 0 .  Multiple Vitamin (MULTIVITAMIN WITH MINERALS) TABS tablet, Take 1 tablet by mouth  daily., Disp: , Rfl:  .  tamsulosin (FLOMAX) 0.4 MG CAPS capsule, Take 1 capsule (0.4 mg total) by mouth daily., Disp: 30 capsule, Rfl: 2  No Known Allergies Past Medical History:  Diagnosis Date  . History of kidney stones   . Renal disorder     Observations/Objective: A&O  No respiratory distress or wheezing audible over the phone Mood, judgement, and thought processes all WNL  Assessment and Plan: 1. Acute prostatitis - Patient only completed 2 week course of antibiotics.  I am sending in another 4 weeks. - ciprofloxacin (CIPRO) 500 MG tablet; Take 1 tablet (500 mg total) by mouth 2 (two) times daily.  Dispense: 56 tablet; Refill: 0 - Ambulatory referral to Urology  2. Urinary retention - Ambulatory referral to Urology  3. Kidney stones - Ambulatory referral to Urology   Follow Up Instructions:  I discussed the assessment and treatment plan with the patient. The patient was provided an opportunity to ask questions and all were answered. The patient agreed with the plan and demonstrated an understanding of the instructions.   The patient was advised to call back or seek an in-person evaluation if the symptoms worsen or if the condition fails to improve as anticipated.  The above assessment and management plan was discussed with the patient. The patient verbalized understanding of and has agreed to the management plan. Patient is aware to call the clinic if symptoms persist or worsen. Patient is aware when to return to the clinic for a  follow-up visit. Patient educated on when it is appropriate to go to the emergency department.   Time call ended: 10:54 AM  I provided 20 minutes of non-face-to-face time during this encounter.  Deliah Boston, MSN, APRN, FNP-C [facility] 05/07/19

## 2019-06-01 ENCOUNTER — Other Ambulatory Visit: Payer: Self-pay

## 2019-06-01 ENCOUNTER — Encounter (HOSPITAL_COMMUNITY): Payer: Self-pay

## 2019-06-01 DIAGNOSIS — Z20822 Contact with and (suspected) exposure to covid-19: Secondary | ICD-10-CM | POA: Insufficient documentation

## 2019-06-01 DIAGNOSIS — N138 Other obstructive and reflux uropathy: Secondary | ICD-10-CM | POA: Diagnosis not present

## 2019-06-01 DIAGNOSIS — N211 Calculus in urethra: Secondary | ICD-10-CM | POA: Insufficient documentation

## 2019-06-01 DIAGNOSIS — R300 Dysuria: Secondary | ICD-10-CM | POA: Diagnosis not present

## 2019-06-01 DIAGNOSIS — R339 Retention of urine, unspecified: Secondary | ICD-10-CM | POA: Diagnosis present

## 2019-06-01 LAB — URINALYSIS, ROUTINE W REFLEX MICROSCOPIC
Bilirubin Urine: NEGATIVE
Glucose, UA: 50 mg/dL — AB
Hgb urine dipstick: NEGATIVE
Ketones, ur: NEGATIVE mg/dL
Nitrite: NEGATIVE
Protein, ur: NEGATIVE mg/dL
Specific Gravity, Urine: 1.02 (ref 1.005–1.030)
pH: 7 (ref 5.0–8.0)

## 2019-06-01 NOTE — ED Triage Notes (Signed)
Unable to urinate x 2 months. Was told he has a kidney stone but something bulging out the tip of his penis. Pt sts only dribbling and not able to void a stream. Wants to make sure it isn't an infection.

## 2019-06-02 ENCOUNTER — Emergency Department (HOSPITAL_COMMUNITY)
Admission: EM | Admit: 2019-06-02 | Discharge: 2019-06-02 | Disposition: A | Discharge status: Home / Self Care | Payer: 59 | Attending: Emergency Medicine | Admitting: Emergency Medicine

## 2019-06-02 DIAGNOSIS — N200 Calculus of kidney: Secondary | ICD-10-CM

## 2019-06-02 DIAGNOSIS — N138 Other obstructive and reflux uropathy: Secondary | ICD-10-CM

## 2019-06-02 LAB — CBC WITH DIFFERENTIAL/PLATELET
Abs Immature Granulocytes: 0.08 10*3/uL — ABNORMAL HIGH (ref 0.00–0.07)
Basophils Absolute: 0.1 10*3/uL (ref 0.0–0.1)
Basophils Relative: 1 %
Eosinophils Absolute: 0.1 10*3/uL (ref 0.0–0.5)
Eosinophils Relative: 1 %
HCT: 41.9 % (ref 39.0–52.0)
Hemoglobin: 14.1 g/dL (ref 13.0–17.0)
Immature Granulocytes: 1 %
Lymphocytes Relative: 17 %
Lymphs Abs: 2.9 10*3/uL (ref 0.7–4.0)
MCH: 29.7 pg (ref 26.0–34.0)
MCHC: 33.7 g/dL (ref 30.0–36.0)
MCV: 88.4 fL (ref 80.0–100.0)
Monocytes Absolute: 1.7 10*3/uL — ABNORMAL HIGH (ref 0.1–1.0)
Monocytes Relative: 10 %
Neutro Abs: 11.8 10*3/uL — ABNORMAL HIGH (ref 1.7–7.7)
Neutrophils Relative %: 70 %
Platelets: 373 10*3/uL (ref 150–400)
RBC: 4.74 MIL/uL (ref 4.22–5.81)
RDW: 12.5 % (ref 11.5–15.5)
WBC: 16.6 10*3/uL — ABNORMAL HIGH (ref 4.0–10.5)
nRBC: 0 % (ref 0.0–0.2)

## 2019-06-02 LAB — BASIC METABOLIC PANEL
Anion gap: 15 (ref 5–15)
BUN: 17 mg/dL (ref 6–20)
CO2: 23 mmol/L (ref 22–32)
Calcium: 9.5 mg/dL (ref 8.9–10.3)
Chloride: 101 mmol/L (ref 98–111)
Creatinine, Ser: 1.26 mg/dL — ABNORMAL HIGH (ref 0.61–1.24)
GFR calc Af Amer: >60 mL/min (ref >60)
GFR calc non Af Amer: >60 mL/min (ref >60)
Glucose, Bld: 124 mg/dL — ABNORMAL HIGH (ref 70–99)
Potassium: 3.7 mmol/L (ref 3.5–5.1)
Sodium: 139 mmol/L (ref 135–145)

## 2019-06-02 LAB — RESPIRATORY PANEL BY RT PCR (FLU A&B, COVID)
Influenza A by PCR: NEGATIVE
Influenza B by PCR: NEGATIVE
SARS Coronavirus 2 by RT PCR: NEGATIVE

## 2019-06-02 MED ORDER — LIDOCAINE HCL 1 % IJ SOLN
INTRAMUSCULAR | Status: AC
  Filled 2019-06-02: qty 20

## 2019-06-02 MED ORDER — KETOROLAC TROMETHAMINE 30 MG/ML IJ SOLN
30.0000 mg | Freq: Once | INTRAMUSCULAR | Status: AC
  Administered 2019-06-02: 30 mg via INTRAMUSCULAR
  Filled 2019-06-02: qty 1

## 2019-06-02 MED ORDER — SODIUM CHLORIDE 0.9 % IV SOLN
1.0000 g | Freq: Once | INTRAVENOUS | Status: AC
  Administered 2019-06-02: 1 g via INTRAVENOUS
  Filled 2019-06-02: qty 10

## 2019-06-02 MED ORDER — FENTANYL CITRATE (PF) 100 MCG/2ML IJ SOLN
50.0000 ug | Freq: Once | INTRAMUSCULAR | Status: AC
  Administered 2019-06-02: 03:00:00 50 ug via INTRAVENOUS
  Filled 2019-06-02: qty 2

## 2019-06-02 MED ORDER — LIDOCAINE HCL URETHRAL/MUCOSAL 2 % EX GEL
1.0000 "application " | Freq: Once | CUTANEOUS | Status: AC
  Administered 2019-06-02: 1 via URETHRAL
  Filled 2019-06-02: qty 30

## 2019-06-02 MED ORDER — OXYCODONE-ACETAMINOPHEN 5-325 MG PO TABS: 1.0000 | ORAL_TABLET | Freq: Four times a day (QID) | ORAL | 0 refills | Status: DC | PRN

## 2019-06-02 MED ORDER — FENTANYL CITRATE (PF) 100 MCG/2ML IJ SOLN
100.0000 ug | Freq: Once | INTRAMUSCULAR | Status: AC
  Administered 2019-06-02: 100 ug via INTRAVENOUS
  Filled 2019-06-02: qty 2

## 2019-06-02 NOTE — ED Provider Notes (Signed)
[facility]-EMERGENCY DEPT Provider Note   CSN: [id number] Arrival date & time: 06/01/19  2026     History Chief Complaint  Patient presents with  . Urinary Retention    Ronald Palmer is a 44 y.o. male with a history of kidney stones and prostatitis who presents to the emergency department with a chief complaint of decreased urine output.  The patient reports significant pain and burning with urination, onset today.  He has been able to urinate, but he has had dribbling.  He was initially having bilateral flank pain several days ago, but this is since resolved.  He has not recently passed any kidney stones that he is aware of.  He reports that he noticed a white area of skin at the tip of the penis earlier today.  He thinks it might be a kidney stone.  He reports that he tried to remove it at home, but was unsuccessful.  He has a follow-up appointment with urology on 4/21.  He has been taking antibiotics for over a month for prostatitis.  No missed doses.  He has had no fevers, chills, nausea, vomiting, abdominal pain, constipation, diarrhea, cough, shortness of breath.  No other treatment prior to arrival.  No testicular pain or swelling.  He has not been sexually active for more than a year.    The history is provided by the patient. No language interpreter was used.       Past Medical History:  Diagnosis Date  . History of kidney stones   . Renal disorder     There are no problems to display for this patient.   Past Surgical History:  Procedure Laterality Date  . LITHOTRIPSY         Family History  Problem Relation Age of Onset  . Other Mother        Bone disease  . Dementia Father   . Heart disease Maternal Grandmother   . Rheum arthritis Maternal Grandmother   . Heart attack Maternal Grandfather   . Alcohol abuse Paternal Grandfather   . Dementia Paternal Grandfather   . Prostate cancer Paternal Uncle     Social History   Tobacco Use  .  Smoking status: Never Smoker  . Smokeless tobacco: Current User    Types: Chew  Substance Use Topics  . Alcohol use: No  . Drug use: Never    Home Medications Prior to Admission medications   Medication Sig Start Date End Date Taking? Authorizing Provider  ciprofloxacin (CIPRO) 500 MG tablet Take 1 tablet (500 mg total) by mouth 2 (two) times daily. 05/07/19  Yes Deliah Boston F, FNP  Multiple Vitamin (MULTIVITAMIN WITH MINERALS) TABS tablet Take 1 tablet by mouth daily.   Yes [provider]  tamsulosin (FLOMAX) 0.4 MG CAPS capsule Take 1 capsule (0.4 mg total) by mouth daily. 04/28/19  Yes Deliah Boston F, FNP  oxyCODONE-acetaminophen (PERCOCET/ROXICET) 5-325 MG tablet Take 1 tablet by mouth every 6 (six) hours as needed for severe pain. 06/02/19   Karra Pink A, PA-C    Allergies    Patient has no known allergies.  Review of Systems   Review of Systems  Constitutional: Negative for appetite change, diaphoresis, fatigue and fever.  Respiratory: Negative for shortness of breath.   Cardiovascular: Negative for chest pain.  Gastrointestinal: Negative for abdominal pain, blood in stool, diarrhea, nausea and vomiting.  Genitourinary: Positive for difficulty urinating, dysuria, flank pain (resolved) and penile pain. Negative for decreased urine volume, discharge,  genital sores, scrotal swelling, testicular pain and urgency.  Musculoskeletal: Negative for back pain, myalgias, neck pain and neck stiffness.  Skin: Negative for rash.  Allergic/Immunologic: Negative for immunocompromised state.  Neurological: Negative for dizziness, seizures, syncope, weakness, numbness and headaches.  Psychiatric/Behavioral: Negative for confusion.    Physical Exam Updated Vital Signs BP 112/84   Pulse 70   Temp 99.3 F (37.4 C) (Oral)   Resp 16   Ht 5\' 6"  (1.676 m)   Wt 122.8 kg   SpO2 95%   BMI 43.71 kg/m   Physical Exam Vitals and nursing note reviewed.  Constitutional:       Appearance: He is well-developed. He is not ill-appearing or toxic-appearing.  HENT:     Head: Normocephalic.  Eyes:     Conjunctiva/sclera: Conjunctivae normal.  Cardiovascular:     Rate and Rhythm: Normal rate and regular rhythm.     Heart sounds: No murmur.  Pulmonary:     Effort: Pulmonary effort is normal.  Abdominal:     General: There is no distension.     Palpations: Abdomen is soft. There is no mass.     Tenderness: There is no abdominal tenderness. There is no right CVA tenderness, left CVA tenderness, guarding or rebound.     Hernia: No hernia is present.     Comments: Abdomen is obese and protuberant, but soft and nontender.  No CVA tenderness bilaterally.  No tenderness over the suprapubic region.  No rebound or guarding.  Normoactive bowel sounds.  Genitourinary:    Comments: Chaperoned exam.  There is a stone obstructed at the urethral meatus.  He is exquisitely tender to palpation around the urethral meatus.  There is no redness or swelling noted to the penis.  Normal exam of the testicles.  He is uncircumcised.  No inguinal lymphadenopathy bilaterally. Musculoskeletal:     Cervical back: Neck supple.  Skin:    General: Skin is warm and dry.  Neurological:     Mental Status: He is alert.  Psychiatric:        Behavior: Behavior normal.     ED Results / Procedures / Treatments   Labs (all labs ordered are listed, but only abnormal results are displayed) Labs Reviewed  URINALYSIS, ROUTINE W REFLEX MICROSCOPIC - Abnormal; Notable for the following components:      Result Value   Glucose, UA 50 (*)    Leukocytes,Ua MODERATE (*)    Bacteria, UA RARE (*)    All other components within normal limits  CBC WITH DIFFERENTIAL/PLATELET - Abnormal; Notable for the following components:   WBC 16.6 (*)    Neutro Abs 11.8 (*)    Monocytes Absolute 1.7 (*)    Abs Immature Granulocytes 0.08 (*)    All other components within normal limits  BASIC METABOLIC PANEL - Abnormal;  Notable for the following components:   Glucose, Bld 124 (*)    Creatinine, Ser 1.26 (*)    All other components within normal limits  RESPIRATORY PANEL BY RT PCR (FLU A&B, COVID)  URINE CULTURE    EKG None  Radiology No results found.  Procedures Procedures (including critical care time)  Medications Ordered in ED Medications  ketorolac (TORADOL) 30 MG/ML injection 30 mg (30 mg Intramuscular Given 06/02/19 0213)  lidocaine (XYLOCAINE) 2 % jelly 1 application (1 application Urethral Given 06/02/19 0213)  fentaNYL (SUBLIMAZE) injection 50 mcg (50 mcg Intravenous Given 06/02/19 0329)  fentaNYL (SUBLIMAZE) injection 100 mcg (100 mcg Intravenous Given 06/02/19 0414)  lidocaine (XYLOCAINE) 1 % (with pres) injection (  Given by Other 06/02/19 0653)  fentaNYL (SUBLIMAZE) injection 100 mcg (100 mcg Intravenous Given 06/02/19 0652)  cefTRIAXone (ROCEPHIN) 1 g in sodium chloride 0.9 % 100 mL IVPB (0 g Intravenous Stopped 06/02/19 0742)    ED Course  I have reviewed the triage vital signs and the nursing notes.  Pertinent labs & imaging results that were available during my care of the patient were reviewed by me and considered in my medical decision making (see chart for details).    MDM Rules/Calculators/A&P                      44 year old male with a history of prostatitis and kidney stones presenting with an obstructive stone located at the urethral meatus that was observed on chaperoned physical exam today.  He has also been having dysuria and urinary dribbling.  Urine does not appear infectious.  There is some pyuria, but I suspect this is secondary to the obstructive stone.  He is already on antibiotics.  He does have a follow-up appointment tomorrow, 4/21, with his urologist.  We will apply urethral lidocaine topically and give Toradol for pain control and attempt manual removal of stone in the ER.  Manual attempt of stone removal by me without success.  Stones palpable at the  urethral meatus and feels approximately 1 cm in diameter.  Second attempt at stone removal by Dr. Manus Gunning, attending physician without successful.  Labs available for leukocytosis of 16.6.  Creatinine is 1.26, up from 1 month ago.  Given urine with leukocyte esterase and pyuria, will give a dose of Rocephin in the ER per Dr. Annabell Howells, urology, who has been consulted given persistent stone obstruction.  Dr. Annabell Howells evaluated the patient in the ER.  He successfully removed 9 mm stone in the proximal urethra.  Please see his note for procedure.  The patient has a follow-up appointment in the office tomorrow, which Dr. Annabell Howells has recommended he keep.  He would also like the patient to be evaluated in 1 to 2 weeks.   We will discharge the patient home with a short course of pain medication.  He is hemodynamically stable and in no acute distress.  All questions answered.  Safe for discharge home with outpatient follow-up as indicated.  Final Clinical Impression(s) / ED Diagnoses Final diagnoses:  Urinary tract obstruction by kidney stone    Rx / DC Orders ED Discharge Orders         Ordered    oxyCODONE-acetaminophen (PERCOCET/ROXICET) 5-325 MG tablet  Every 6 hours PRN     06/02/19 0741           Barkley Boards, PA-C 06/02/19 0017    Glynn Octave, MD 06/02/19 [phone]

## 2019-06-02 NOTE — ED Notes (Signed)
Pt given sandwich and PO fluids.

## 2019-06-02 NOTE — Consult Note (Signed)
Subjective: CC: Urethral meatal stone.   Ronald Palmer is a 44 yo WM with a history of stones who was seen in the ER in February for flank pain.  He was not found to have a ureteral or bladder stone but did have punctate renal stones and on my review today there was a 52mm stone in the proximal urethra.  He has irritative voiding symptoms and was treated for presumed prostatitis for the last 2 weeks with cipro.  He presented to the ER today with the stone lodged at the urethral meatus with significant pain and dribbling urination.   Attempts by the EDP to remove the stone were unsuccessful.   He has had ESWL for a stone in the past.   ROS:  Review of Systems  All other systems reviewed and are negative.   No Known Allergies  Past Medical History:  Diagnosis Date  . History of kidney stones   . Renal disorder     Past Surgical History:  Procedure Laterality Date  . LITHOTRIPSY      Social History   Socioeconomic History  . Marital status: Legally Separated    Spouse name: Not on file  . Number of children: Not on file  . Years of education: Not on file  . Highest education level: Not on file  Occupational History  . Not on file  Tobacco Use  . Smoking status: Never Smoker  . Smokeless tobacco: Current User    Types: Chew  Substance and Sexual Activity  . Alcohol use: No  . Drug use: Never  . Sexual activity: Not on file  Other Topics Concern  . Not on file  Social History Narrative  . Not on file   Social Determinants of Health   Financial Resource Strain:   . Difficulty of Paying Living Expenses:   Food Insecurity:   . Worried About Charity fundraiser in the Last Year:   . Arboriculturist in the Last Year:   Transportation Needs:   . Film/video editor (Medical):   Marland Kitchen Lack of Transportation (Non-Medical):   Physical Activity:   . Days of Exercise per Week:   . Minutes of Exercise per Session:   Stress:   . Feeling of Stress :   Social Connections:   .  Frequency of Communication with Friends and Family:   . Frequency of Social Gatherings with Friends and Family:   . Attends Religious Services:   . Active Member of Clubs or Organizations:   . Attends Archivist Meetings:   Marland Kitchen Marital Status:   Intimate Partner Violence:   . Fear of Current or Ex-Partner:   . Emotionally Abused:   Marland Kitchen Physically Abused:   . Sexually Abused:     Family History  Problem Relation Age of Onset  . Other Mother        Bone disease  . Dementia Father   . Heart disease Maternal Grandmother   . Rheum arthritis Maternal Grandmother   . Heart attack Maternal Grandfather   . Alcohol abuse Paternal Grandfather   . Dementia Paternal Grandfather   . Prostate cancer Paternal Uncle     Anti-infectives: Anti-infectives (From admission, onward)   None      No current facility-administered medications for this encounter.   Current Outpatient Medications  Medication Sig Dispense Refill  . ciprofloxacin (CIPRO) 500 MG tablet Take 1 tablet (500 mg total) by mouth 2 (two) times daily. 56 tablet 0  . Multiple  Vitamin (MULTIVITAMIN WITH MINERALS) TABS tablet Take 1 tablet by mouth daily.    . tamsulosin (FLOMAX) 0.4 MG CAPS capsule Take 1 capsule (0.4 mg total) by mouth daily. 30 capsule 2     Objective: Vital signs in last 24 hours: BP (!) 113/97   Pulse 74   Temp 99.3 F (37.4 C) (Oral)   Resp 17   Ht 5\' 6"  (1.676 m)   Wt 122.8 kg   SpO2 94%   BMI 43.71 kg/m   Intake/Output from previous day: No intake/output data recorded. Intake/Output this shift: No intake/output data recorded.   Physical Exam Vitals reviewed.  Constitutional:      General: He is not in acute distress.    Appearance: Normal appearance. He is obese.  Genitourinary:    Comments: Circumcised phallus with a stone at the meatus.    Neurological:     Mental Status: He is alert.     Lab Results:  Results for orders placed or performed during the hospital  encounter of 06/02/19 (from the past 24 hour(s))  Urinalysis, Routine w reflex microscopic     Status: Abnormal   Collection Time: 06/01/19  9:12 PM  Result Value Ref Range   Color, Urine YELLOW YELLOW   APPearance CLEAR CLEAR   Specific Gravity, Urine 1.020 1.005 - 1.030   pH 7.0 5.0 - 8.0   Glucose, UA 50 (A) NEGATIVE mg/dL   Hgb urine dipstick NEGATIVE NEGATIVE   Bilirubin Urine NEGATIVE NEGATIVE   Ketones, ur NEGATIVE NEGATIVE mg/dL   Protein, ur NEGATIVE NEGATIVE mg/dL   Nitrite NEGATIVE NEGATIVE   Leukocytes,Ua MODERATE (A) NEGATIVE   RBC / HPF 6-10 0 - 5 RBC/hpf   WBC, UA 21-50 0 - 5 WBC/hpf   Bacteria, UA RARE (A) NONE SEEN   Squamous Epithelial / LPF 0-5 0 - 5   Mucus PRESENT   CBC with Differential     Status: Abnormal   Collection Time: 06/02/19  3:17 AM  Result Value Ref Range   WBC 16.6 (H) 4.0 - 10.5 K/uL   RBC 4.74 4.22 - 5.81 MIL/uL   Hemoglobin 14.1 13.0 - 17.0 g/dL   HCT 06/04/19 95.2 - 84.1 %   MCV 88.4 80.0 - 100.0 fL   MCH 29.7 26.0 - 34.0 pg   MCHC 33.7 30.0 - 36.0 g/dL   RDW 32.4 40.1 - 02.7 %   Platelets 373 150 - 400 K/uL   nRBC 0.0 0.0 - 0.2 %   Neutrophils Relative % 70 %   Neutro Abs 11.8 (H) 1.7 - 7.7 K/uL   Lymphocytes Relative 17 %   Lymphs Abs 2.9 0.7 - 4.0 K/uL   Monocytes Relative 10 %   Monocytes Absolute 1.7 (H) 0.1 - 1.0 K/uL   Eosinophils Relative 1 %   Eosinophils Absolute 0.1 0.0 - 0.5 K/uL   Basophils Relative 1 %   Basophils Absolute 0.1 0.0 - 0.1 K/uL   Immature Granulocytes 1 %   Abs Immature Granulocytes 0.08 (H) 0.00 - 0.07 K/uL  Basic metabolic panel     Status: Abnormal   Collection Time: 06/02/19  3:17 AM  Result Value Ref Range   Sodium 139 135 - 145 mmol/L   Potassium 3.7 3.5 - 5.1 mmol/L   Chloride 101 98 - 111 mmol/L   CO2 23 22 - 32 mmol/L   Glucose, Bld 124 (H) 70 - 99 mg/dL   BUN 17 6 - 20 mg/dL   Creatinine, Ser 06/04/19 (  H) 0.61 - 1.24 mg/dL   Calcium 9.5 8.9 - 15.4 mg/dL   GFR calc non Af Amer >60 >60 mL/min    GFR calc Af Amer >60 >60 mL/min   Anion gap 15 5 - 15  Respiratory Panel by RT PCR (Flu A&B, Covid) - Nasopharyngeal Swab     Status: None   Collection Time: 06/02/19  4:34 AM   Specimen: Nasopharyngeal Swab  Result Value Ref Range   SARS Coronavirus 2 by RT PCR NEGATIVE NEGATIVE   Influenza A by PCR NEGATIVE NEGATIVE   Influenza B by PCR NEGATIVE NEGATIVE    BMET Recent Labs    06/02/19 0317  NA 139  K 3.7  CL 101  CO2 23  GLUCOSE 124*  BUN 17  CREATININE 1.26*  CALCIUM 9.5   PT/INR No results for input(s): LABPROT, INR in the last 72 hours. ABG No results for input(s): PHART, HCO3 in the last 72 hours.  Invalid input(s): PCO2, PO2  Studies/Results: No results found.  Procedure:  His penis was prepped with betadine and the ventral glans was infiltrated with 68ml of 1% lidocaine.  I was able to dislodge the stone more proximally and grasp it with a hemostat and remove it.   Assessment/Plan: Urethral meatal stone.  I was able to remove the stone.  He has f/u in our office tomorrow and can keep that appointment.    Possible UTI.  He has been on Cipro but has a LG fever and leukocytosis today.  I have ordered a culture and recommended a dose of rocephin to broaden coverage pending the culture.   CC: Dr. Glynn Octave.      Ronald Palmer 06/02/2019 [phone]

## 2019-06-02 NOTE — Discharge Instructions (Addendum)
Thank you for allowing me to care for you today in the Emergency Department.   Keep your follow-up appointment at [facility] urology if it is still scheduled.    If not, you should receive a call from the office for a follow-up appointment and 1 to 2 weeks.  Take 650 mg of Tylenol or 600 mg of ibuprofen with food every 6 hours for pain.  You can alternate between these 2 medications every 3 hours if your pain returns.  For instance, you can take Tylenol at noon, followed by a dose of ibuprofen at 3, followed by second dose of Tylenol and 6.  For severe, uncontrollable pain, you can take 1 tablet of Percocet every 6 hours as needed.  Do not work or drive while taking this medication as it can cause you to be impaired.  Return to the emergency department if you become unable to urinate, develop a significant amount of blood in your urine, if you develop high fevers despite taking your home Ciprofloxacin, or if you develop other new, concerning symptoms.

## 2019-06-03 LAB — URINE CULTURE

## 2021-01-17 ENCOUNTER — Other Ambulatory Visit: Payer: Self-pay

## 2021-01-17 ENCOUNTER — Emergency Department (HOSPITAL_COMMUNITY): Payer: 59

## 2021-01-17 ENCOUNTER — Inpatient Hospital Stay (HOSPITAL_COMMUNITY)
Admission: EM | Admit: 2021-01-17 | Discharge: 2021-01-19 | DRG: 638 | Disposition: A | Discharge status: Home / Self Care | Payer: 59 | Attending: Internal Medicine | Admitting: Internal Medicine

## 2021-01-17 ENCOUNTER — Encounter (HOSPITAL_COMMUNITY): Payer: Self-pay

## 2021-01-17 DIAGNOSIS — F17220 Nicotine dependence, chewing tobacco, uncomplicated: Secondary | ICD-10-CM | POA: Diagnosis present

## 2021-01-17 DIAGNOSIS — E1110 Type 2 diabetes mellitus with ketoacidosis without coma: Secondary | ICD-10-CM | POA: Diagnosis not present

## 2021-01-17 DIAGNOSIS — E669 Obesity, unspecified: Secondary | ICD-10-CM | POA: Diagnosis present

## 2021-01-17 DIAGNOSIS — Z6841 Body Mass Index (BMI) 40.0 and over, adult: Secondary | ICD-10-CM

## 2021-01-17 DIAGNOSIS — E876 Hypokalemia: Secondary | ICD-10-CM | POA: Diagnosis present

## 2021-01-17 DIAGNOSIS — D72829 Elevated white blood cell count, unspecified: Secondary | ICD-10-CM | POA: Diagnosis present

## 2021-01-17 DIAGNOSIS — E119 Type 2 diabetes mellitus without complications: Secondary | ICD-10-CM | POA: Diagnosis not present

## 2021-01-17 DIAGNOSIS — Z20822 Contact with and (suspected) exposure to covid-19: Secondary | ICD-10-CM | POA: Diagnosis present

## 2021-01-17 DIAGNOSIS — E1310 Other specified diabetes mellitus with ketoacidosis without coma: Secondary | ICD-10-CM

## 2021-01-17 DIAGNOSIS — R030 Elevated blood-pressure reading, without diagnosis of hypertension: Secondary | ICD-10-CM | POA: Diagnosis present

## 2021-01-17 LAB — OSMOLALITY: Osmolality: 318 mOsm/kg — ABNORMAL HIGH (ref 275–295)

## 2021-01-17 LAB — BLOOD GAS, VENOUS
Acid-base deficit: 7.6 mmol/L — ABNORMAL HIGH (ref 0.0–2.0)
Bicarbonate: 18.1 mmol/L — ABNORMAL LOW (ref 20.0–28.0)
FIO2: 21
O2 Saturation: 76.3 %
Patient temperature: 98.6
pCO2, Ven: 39 mmHg — ABNORMAL LOW (ref 44.0–60.0)
pH, Ven: 7.288 (ref 7.250–7.430)
pO2, Ven: 46.1 mmHg — ABNORMAL HIGH (ref 32.0–45.0)

## 2021-01-17 LAB — COMPREHENSIVE METABOLIC PANEL
ALT: 45 U/L — ABNORMAL HIGH (ref 0–44)
AST: 25 U/L (ref 15–41)
Albumin: 4.3 g/dL (ref 3.5–5.0)
Alkaline Phosphatase: 104 U/L (ref 38–126)
Anion gap: 19 — ABNORMAL HIGH (ref 5–15)
BUN: 20 mg/dL (ref 6–20)
CO2: 17 mmol/L — ABNORMAL LOW (ref 22–32)
Calcium: 9.6 mg/dL (ref 8.9–10.3)
Chloride: 93 mmol/L — ABNORMAL LOW (ref 98–111)
Creatinine, Ser: 1.23 mg/dL (ref 0.61–1.24)
GFR, Estimated: >60 mL/min (ref >60)
Glucose, Bld: 450 mg/dL — ABNORMAL HIGH (ref 70–99)
Potassium: 4 mmol/L (ref 3.5–5.1)
Sodium: 129 mmol/L — ABNORMAL LOW (ref 135–145)
Total Bilirubin: 1.7 mg/dL — ABNORMAL HIGH (ref 0.3–1.2)
Total Protein: 7.9 g/dL (ref 6.5–8.1)

## 2021-01-17 LAB — CBG MONITORING, ED
Glucose-Capillary: 421 mg/dL — ABNORMAL HIGH (ref 70–99)
Glucose-Capillary: 493 mg/dL — ABNORMAL HIGH (ref 70–99)
Glucose-Capillary: 403 mg/dL — ABNORMAL HIGH (ref 70–99)
Glucose-Capillary: 369 mg/dL — ABNORMAL HIGH (ref 70–99)

## 2021-01-17 LAB — LIPASE, BLOOD: Lipase: 52 U/L — ABNORMAL HIGH (ref 11–51)

## 2021-01-17 LAB — RESP PANEL BY RT-PCR (FLU A&B, COVID) ARPGX2
Influenza A by PCR: NEGATIVE
Influenza B by PCR: NEGATIVE
SARS Coronavirus 2 by RT PCR: NEGATIVE

## 2021-01-17 LAB — CBC WITH DIFFERENTIAL/PLATELET
Abs Immature Granulocytes: 0.06 10*3/uL (ref 0.00–0.07)
Basophils Absolute: 0.2 10*3/uL — ABNORMAL HIGH (ref 0.0–0.1)
Basophils Relative: 1 %
Eosinophils Absolute: 0.2 10*3/uL (ref 0.0–0.5)
Eosinophils Relative: 1 %
HCT: 43.8 % (ref 39.0–52.0)
Hemoglobin: 15.7 g/dL (ref 13.0–17.0)
Immature Granulocytes: 1 %
Lymphocytes Relative: 20 %
Lymphs Abs: 2.6 10*3/uL (ref 0.7–4.0)
MCH: 30.5 pg (ref 26.0–34.0)
MCHC: 35.8 g/dL (ref 30.0–36.0)
MCV: 85.2 fL (ref 80.0–100.0)
Monocytes Absolute: 0.8 10*3/uL (ref 0.1–1.0)
Monocytes Relative: 6 %
Neutro Abs: 8.9 10*3/uL — ABNORMAL HIGH (ref 1.7–7.7)
Neutrophils Relative %: 71 %
Platelets: 429 10*3/uL — ABNORMAL HIGH (ref 150–400)
RBC: 5.14 MIL/uL (ref 4.22–5.81)
RDW: 12.1 % (ref 11.5–15.5)
WBC: 12.6 10*3/uL — ABNORMAL HIGH (ref 4.0–10.5)
nRBC: 0 % (ref 0.0–0.2)

## 2021-01-17 LAB — BASIC METABOLIC PANEL
Anion gap: 20 — ABNORMAL HIGH (ref 5–15)
BUN: 20 mg/dL (ref 6–20)
CO2: 14 mmol/L — ABNORMAL LOW (ref 22–32)
Calcium: 9.4 mg/dL (ref 8.9–10.3)
Chloride: 95 mmol/L — ABNORMAL LOW (ref 98–111)
Creatinine, Ser: 1.33 mg/dL — ABNORMAL HIGH (ref 0.61–1.24)
GFR, Estimated: >60 mL/min (ref >60)
Glucose, Bld: 494 mg/dL — ABNORMAL HIGH (ref 70–99)
Potassium: 4.4 mmol/L (ref 3.5–5.1)
Sodium: 129 mmol/L — ABNORMAL LOW (ref 135–145)

## 2021-01-17 LAB — URINALYSIS, ROUTINE W REFLEX MICROSCOPIC
Bacteria, UA: NONE SEEN
Glucose, UA: >=500 mg/dL — AB
Ketones, ur: 80 mg/dL — AB
Leukocytes,Ua: NEGATIVE
Nitrite: NEGATIVE
Protein, ur: NEGATIVE mg/dL
Specific Gravity, Urine: 1.025 (ref 1.005–1.030)
pH: 6 (ref 5.0–8.0)

## 2021-01-17 LAB — HEMOGLOBIN A1C
Hgb A1c MFr Bld: 11.7 % — ABNORMAL HIGH (ref 4.8–5.6)
Mean Plasma Glucose: 289.09 mg/dL

## 2021-01-17 LAB — BETA-HYDROXYBUTYRIC ACID: Beta-Hydroxybutyric Acid: 6.77 mmol/L — ABNORMAL HIGH (ref 0.05–0.27)

## 2021-01-17 LAB — MAGNESIUM: Magnesium: 2.2 mg/dL (ref 1.7–2.4)

## 2021-01-17 MED ORDER — ENOXAPARIN SODIUM 40 MG/0.4ML IJ SOSY
40.0000 mg | PREFILLED_SYRINGE | INTRAMUSCULAR | Status: DC
  Administered 2021-01-17 – 2021-01-18 (×2): 40 mg via SUBCUTANEOUS
  Filled 2021-01-17 (×2): qty 0.4

## 2021-01-17 MED ORDER — DEXTROSE IN LACTATED RINGERS 5 % IV SOLN: INTRAVENOUS | Status: DC

## 2021-01-17 MED ORDER — INSULIN REGULAR(HUMAN) IN NACL 100-0.9 UT/100ML-% IV SOLN
INTRAVENOUS | Status: AC
  Administered 2021-01-17 – 2021-01-18 (×2): 8 [IU]/h via INTRAVENOUS
  Filled 2021-01-17 (×2): qty 100

## 2021-01-17 MED ORDER — LACTATED RINGERS IV BOLUS
20.0000 mL/kg | Freq: Once | INTRAVENOUS | Status: AC
  Administered 2021-01-17: 2340 mL via INTRAVENOUS

## 2021-01-17 MED ORDER — ACETAMINOPHEN 325 MG PO TABS
650.0000 mg | ORAL_TABLET | Freq: Four times a day (QID) | ORAL | Status: DC | PRN
  Administered 2021-01-18 (×2): 650 mg via ORAL
  Filled 2021-01-17 (×2): qty 2

## 2021-01-17 MED ORDER — DEXTROSE 50 % IV SOLN: 0.0000 mL | INTRAVENOUS | Status: DC | PRN

## 2021-01-17 MED ORDER — HYDROCODONE-ACETAMINOPHEN 5-325 MG PO TABS: 1.0000 | ORAL_TABLET | Freq: Four times a day (QID) | ORAL | Status: DC | PRN

## 2021-01-17 MED ORDER — POTASSIUM CHLORIDE 10 MEQ/100ML IV SOLN
10.0000 meq | INTRAVENOUS | Status: AC
  Administered 2021-01-17 (×2): 10 meq via INTRAVENOUS
  Filled 2021-01-17 (×2): qty 100

## 2021-01-17 MED ORDER — LACTATED RINGERS IV SOLN: INTRAVENOUS | Status: DC

## 2021-01-17 NOTE — ED Provider Notes (Signed)
Emergency Medicine Provider Triage Evaluation Note  Ronald Palmer , a 45 y.o. male  was evaluated in triage.  Pt complains of 2 complaints. He reports that he has had dizziness and frequent urination since Saturday.  He has a extensive history of kidney stones and feels like this may be similar.  He has had lower abdominal pain since yesterday. He went to urgent care originally where he was found to be significantly hyperglycemic.  He denies any fevers.  He has no history of diabetes diagnosed.  He has not had any steroid use recently. He does note that he has intentionally lost about 70 pounds over the past year.  He does not have a primary care doctor and does not get regular screenings.  Review of Systems  Positive: Lightheadedness, frequent urination, lower abdominal pain Negative: Fevers  Physical Exam  BP 140/82 (BP Location: Left Arm)   Pulse 88   Temp 98.1 F (36.7 C) (Oral)   SpO2 97%  Gen:   Awake, no distress   Resp:  Normal effort  MSK:   Moves extremities without difficulty  Other:  Patient is awake and alert.  Answers questions appropriately.  Medical Decision Making  Medically screening exam initiated at 12:38 PM.  Appropriate orders placed.  Barett Whidbee was informed that the remainder of the evaluation will be completed by another provider, this initial triage assessment does not replace that evaluation, and the importance of remaining in the ED until their evaluation is complete.  Patient presents for concern of hyperglycemia.  He originally went to urgent care for what he felt like was a kidney stone based on his extensive history.  He is significantly hyperglycemic however is not tachycardic or tachypneic, is afebrile and is generally well-appearing. Given his abdominal pain with history of kidney stones and stating this feels similar will obtain CT renal study. Additionally will obtain labs to differentiate between DKA versus HHS versus uncomplicated hyperglycemia and an  A1c as this appears to be a new diagnosis of diabetes.  Note: Portions of this report may have been transcribed using voice recognition software. Every effort was made to ensure accuracy; however, inadvertent computerized transcription errors may be present    Cristina Gong, PA-C 01/17/21 1241    Melene Plan, DO 01/17/21 1409

## 2021-01-17 NOTE — H&P (Addendum)
History and Physical    Ronald Palmer DDU:[id number] DOB: [birth year] DOA: 01/17/2021  PCP: Gwenlyn Fudge, FNP   Patient coming from: Home   Chief Complaint: Polyuria, dizziness, abdominal discomfort, blurred vision   HPI: Ronald Palmer is a pleasant 45 y.o. male with medical history significant for nephrolithiasis and BMI 42, now presenting to the emergency department for evaluation of lightheadedness, polyuria, and abdominal discomfort.  Patient reports he been in his usual state of health until noting some blurred vision on 01/14/2021.  He also developed polyuria, polydipsia, and indigestion around the same time.  Symptoms have persisted and he has also developed some abdominal discomfort.  Denies any fevers, chills, cough, or chest pain.  ED Course: Upon arrival to the ED, patient is found to be afebrile, saturating well on room air, and with stable blood pressure.  EKG features sinus rhythm.  CT renal stone study reveals 4 mm nonobstructive stone in the upper pole of the left kidney without hydronephrosis or other acute findings.  Diffuse hepatic steatosis again noted on CT.  Chemistry panel features a glucose of 450, bicarbonate 17, and anion gap 19.  ALT and total bilirubin were slightly elevated.  There is a leukocytosis to 12,600 and mild thrombocytosis on CBC.  Urine ketones were noted.  Patient was given 2.3 L of LR, 20 mEq IV potassium, and started on insulin infusion in the ED.  Review of Systems:  All other systems reviewed and apart from HPI, are negative.  Past Medical History:  Diagnosis Date   History of kidney stones    Renal disorder     Past Surgical History:  Procedure Laterality Date   LITHOTRIPSY      Social History:   reports that he has never smoked. His smokeless tobacco use includes chew. He reports that he does not drink alcohol and does not use drugs.  No Known Allergies  Family History  Problem Relation Age of Onset   Other Mother        Bone disease    Dementia Father    Heart disease Maternal Grandmother    Rheum arthritis Maternal Grandmother    Heart attack Maternal Grandfather    Alcohol abuse Paternal Grandfather    Dementia Paternal Grandfather    Prostate cancer Paternal Uncle      Prior to Admission medications   Medication Sig Start Date End Date Taking? Authorizing Provider  ciprofloxacin (CIPRO) 500 MG tablet Take 1 tablet (500 mg total) by mouth 2 (two) times daily. Patient not taking: Reported on 01/17/2021 05/07/19   Gwenlyn Fudge, FNP  oxyCODONE-acetaminophen (PERCOCET/ROXICET) 5-325 MG tablet Take 1 tablet by mouth every 6 (six) hours as needed for severe pain. Patient not taking: Reported on 01/17/2021 06/02/19   McDonald, Pedro Earls A, PA-C  tamsulosin (FLOMAX) 0.4 MG CAPS capsule Take 1 capsule (0.4 mg total) by mouth daily. Patient not taking: Reported on 01/17/2021 04/28/19   Gwenlyn Fudge, FNP    Physical Exam: Vitals:   01/17/21 1236 01/17/21 1522 01/17/21 1833 01/17/21 1845  BP: 140/82 123/86  (!) 131/97  Pulse: 88 92  98  Resp:  16  18  Temp: 98.1 F (36.7 C)     TempSrc: Oral     SpO2: 97% 95%  99%  Weight:   117 kg     Constitutional: NAD, calm  Eyes: PERTLA, lids and conjunctivae normal ENMT: Mucous membranes are dry. Posterior pharynx clear of any exudate or lesions.   Neck: supple, no  masses  Respiratory: no wheezing, no crackles. No accessory muscle use.  Cardiovascular: S1 & S2 heard, regular rate and rhythm. No extremity edema.   Abdomen: No distension, no tenderness, soft. Bowel sounds active.  Musculoskeletal: no clubbing / cyanosis. No joint deformity upper and lower extremities.   Skin: no significant rashes, lesions, ulcers. Warm, dry, well-perfused. Neurologic: CN 2-12 grossly intact. Moving all extremities. Alert and oriented.  Psychiatric: Pleasant. Cooperative.    Labs and Imaging on Admission: I have personally reviewed following labs and imaging studies  CBC: Recent Labs  Lab  01/17/21 1235  WBC 12.6*  NEUTROABS 8.9*  HGB 15.7  HCT 43.8  MCV 85.2  PLT 429*   Basic Metabolic Panel: Recent Labs  Lab 01/17/21 1235  NA 129*  K 4.0  CL 93*  CO2 17*  GLUCOSE 450*  BUN 20  CREATININE 1.23  CALCIUM 9.6  MG 2.2   GFR: CrCl cannot be calculated (Unknown ideal weight.). Liver Function Tests: Recent Labs  Lab 01/17/21 1235  AST 25  ALT 45*  ALKPHOS 104  BILITOT 1.7*  PROT 7.9  ALBUMIN 4.3   Recent Labs  Lab 01/17/21 1235  LIPASE 52*   No results for input(s): AMMONIA in the last 168 hours. Coagulation Profile: No results for input(s): INR, PROTIME in the last 168 hours. Cardiac Enzymes: No results for input(s): CKTOTAL, CKMB, CKMBINDEX, TROPONINI in the last 168 hours. BNP (last 3 results) No results for input(s): PROBNP in the last 8760 hours. HbA1C: Recent Labs    01/17/21 1235  HGBA1C 11.7*   CBG: Recent Labs  Lab 01/17/21 1234 01/17/21 1834  GLUCAP 421* 493*   Lipid Profile: No results for input(s): CHOL, HDL, LDLCALC, TRIG, CHOLHDL, LDLDIRECT in the last 72 hours. Thyroid Function Tests: No results for input(s): TSH, T4TOTAL, FREET4, T3FREE, THYROIDAB in the last 72 hours. Anemia Panel: No results for input(s): VITAMINB12, FOLATE, FERRITIN, TIBC, IRON, RETICCTPCT in the last 72 hours. Urine analysis:    Component Value Date/Time   COLORURINE YELLOW 01/17/2021 1232   APPEARANCEUR CLEAR 01/17/2021 1232   LABSPEC 1.025 01/17/2021 1232   PHURINE 6.0 01/17/2021 1232   GLUCOSEU >=500 (A) 01/17/2021 1232   HGBUR SMALL (A) 01/17/2021 1232   BILIRUBINUR SMALL (A) 01/17/2021 1232   KETONESUR 80 (A) 01/17/2021 1232   PROTEINUR NEGATIVE 01/17/2021 1232   UROBILINOGEN 0.2 10/15/2013 2055   NITRITE NEGATIVE 01/17/2021 1232   LEUKOCYTESUR NEGATIVE 01/17/2021 1232   Sepsis Labs: @LABRCNTIP (procalcitonin:4,lacticidven:4) )No results found for this or any previous visit (from the past 240 hour(s)).   Radiological Exams on  Admission: CT Renal Stone Study  Result Date: 01/17/2021 CLINICAL DATA:  Flank pain. Kidney stone suspected. Dizziness. Frequent urination. Lower abdominal pain. EXAM: CT ABDOMEN AND PELVIS WITHOUT CONTRAST TECHNIQUE: Multidetector CT imaging of the abdomen and pelvis was performed following the standard protocol without IV contrast. COMPARISON:  04/10/2019 FINDINGS: Lower chest: Normal Hepatobiliary: Diffuse fatty change of the liver. No focal lesion. No calcified gallstones. Pancreas: Normal Spleen: Normal Adrenals/Urinary Tract: Adrenal glands are normal. Right kidney is normal. No cyst, mass, stone or hydronephrosis. Left kidney shows a 4 mm nonobstructing stone in the upper pole. No hydronephrosis. No passing stone. No stone in the bladder. Stomach/Bowel: Stomach and small intestine are normal. Normal appendix. No colon abnormality. Vascular/Lymphatic: Aorta and IVC are normal.  No adenopathy. Reproductive: Normal Other: No free fluid or air. Musculoskeletal: Ordinary mild lower lumbar degenerative changes. IMPRESSION: 4 mm nonobstructing stone in the upper  pole of the left kidney. No hydronephrosis or passing stone. No stone in the bladder. Diffuse steatosis of the liver as seen previously. No focal or acute finding. Electronically Signed   By: Paulina Fusi M.D.   On: 01/17/2021 13:28    EKG: Independently reviewed. Sinus rhythm, rate 84, QTc 414 ms.   Assessment/Plan   1. DKA; new diabetes diagnosis  - New diagnosis of DM (A1c 11.7%) presenting with DKA  - IVF and IV insulin started in ED  - Continue IVF, continue IV insulin with frequent CBGs and serial chem panels, consult diabetes educator     DVT prophylaxis: Lovenox  Code Status: Full  Level of Care: Level of care: Progressive Family Communication: None present   Disposition Plan:  Patient is from: Home  Anticipated d/c is to: Home  Anticipated d/c date is: 12/7 or 01/19/21  Patient currently: Pending glycemic control, diabetes  education  Consults called: none  Admission status: Observation     Briscoe Deutscher, MD [facility]  01/17/2021, 7:28 PM

## 2021-01-17 NOTE — ED Provider Notes (Signed)
[facility]  HOSPITAL-EMERGENCY DEPT Provider Note   CSN: [id number] Arrival date & time: 01/17/21  1219     History Chief Complaint  Patient presents with   Dizziness   Urinary Frequency    Ronald Palmer is a 45 y.o. male.  Pt presents to the ED today with dizziness, frequent urination, and feeling very thirsty.  Pt said he's been feeling like this since Saturday, 12/3.  Pt has not seen a doctor in over 2 years.  [facility] to [facility] who checked his bs and it was elevated.  They sent him here for further eval.  Pt said he feels weak.  He hurts all over.  No hx of dm.      Past Medical History:  Diagnosis Date   History of kidney stones    Renal disorder     Patient Active Problem List   Diagnosis Date Noted   DKA (diabetic ketoacidosis) (HCC) 01/17/2021     Past Surgical History:  Procedure Laterality Date   LITHOTRIPSY         Family History  Problem Relation Age of Onset   Other Mother        Bone disease   Dementia Father    Heart disease Maternal Grandmother    Rheum arthritis Maternal Grandmother    Heart attack Maternal Grandfather    Alcohol abuse Paternal Grandfather    Dementia Paternal Grandfather    Prostate cancer Paternal Uncle     Social History   Tobacco Use   Smoking status: Never   Smokeless tobacco: Current    Types: Chew  Vaping Use   Vaping Use: Never used  Substance Use Topics   Alcohol use: No   Drug use: Never    Home Medications Prior to Admission medications   Medication Sig Start Date End Date Taking? Authorizing Provider  ciprofloxacin (CIPRO) 500 MG tablet Take 1 tablet (500 mg total) by mouth 2 (two) times daily. 05/07/19   Gwenlyn Fudge, FNP  Multiple Vitamin (MULTIVITAMIN WITH MINERALS) TABS tablet Take 1 tablet by mouth daily.    [provider]  oxyCODONE-acetaminophen (PERCOCET/ROXICET) 5-325 MG tablet Take 1 tablet by mouth every 6 (six) hours as needed for severe pain. 06/02/19   McDonald,  Mia A, PA-C  tamsulosin (FLOMAX) 0.4 MG CAPS capsule Take 1 capsule (0.4 mg total) by mouth daily. 04/28/19   Gwenlyn Fudge, FNP    Allergies    Patient has no known allergies.  Review of Systems   Review of Systems  Endocrine: Positive for polydipsia and polyuria.  Neurological:  Positive for weakness.  All other systems reviewed and are negative.  Physical Exam Updated Vital Signs BP (!) 131/97   Pulse 98   Temp 98.1 F (36.7 C) (Oral)   Resp 18   Wt 117 kg   SpO2 99%   BMI 41.64 kg/m   Physical Exam Vitals and nursing note reviewed.  Constitutional:      Appearance: Normal appearance. He is obese.  HENT:     Head: Normocephalic and atraumatic.     Right Ear: External ear normal.     Left Ear: External ear normal.     Nose: Nose normal.     Mouth/Throat:     Mouth: Mucous membranes are dry.  Eyes:     Extraocular Movements: Extraocular movements intact.     Conjunctiva/sclera: Conjunctivae normal.     Pupils: Pupils are equal, round, and reactive to light.  Cardiovascular:  Rate and Rhythm: Normal rate and regular rhythm.     Pulses: Normal pulses.     Heart sounds: Normal heart sounds.  Pulmonary:     Effort: Pulmonary effort is normal.     Breath sounds: Normal breath sounds.  Abdominal:     General: Abdomen is flat. Bowel sounds are normal.     Palpations: Abdomen is soft.  Musculoskeletal:        General: Normal range of motion.     Cervical back: Normal range of motion and neck supple.  Skin:    General: Skin is warm.     Capillary Refill: Capillary refill takes less than 2 seconds.  Neurological:     General: No focal deficit present.     Mental Status: He is alert and oriented to person, place, and time.  Psychiatric:        Mood and Affect: Mood normal.        Behavior: Behavior normal.    ED Results / Procedures / Treatments   Labs (all labs ordered are listed, but only abnormal results are displayed) Labs Reviewed  URINALYSIS,  ROUTINE W REFLEX MICROSCOPIC - Abnormal; Notable for the following components:      Result Value   Glucose, UA >=500 (*)    Hgb urine dipstick SMALL (*)    Bilirubin Urine SMALL (*)    Ketones, ur 80 (*)    All other components within normal limits  COMPREHENSIVE METABOLIC PANEL - Abnormal; Notable for the following components:   Sodium 129 (*)    Chloride 93 (*)    CO2 17 (*)    Glucose, Bld 450 (*)    ALT 45 (*)    Total Bilirubin 1.7 (*)    Anion gap 19 (*)    All other components within normal limits  CBC WITH DIFFERENTIAL/PLATELET - Abnormal; Notable for the following components:   WBC 12.6 (*)    Platelets 429 (*)    Neutro Abs 8.9 (*)    Basophils Absolute 0.2 (*)    All other components within normal limits  HEMOGLOBIN A1C - Abnormal; Notable for the following components:   Hgb A1c MFr Bld 11.7 (*)    All other components within normal limits  LIPASE, BLOOD - Abnormal; Notable for the following components:   Lipase 52 (*)    All other components within normal limits  BLOOD GAS, VENOUS - Abnormal; Notable for the following components:   pCO2, Ven 39.0 (*)    pO2, Ven 46.1 (*)    Bicarbonate 18.1 (*)    Acid-base deficit 7.6 (*)    All other components within normal limits  CBG MONITORING, ED - Abnormal; Notable for the following components:   Glucose-Capillary 421 (*)    All other components within normal limits  CBG MONITORING, ED - Abnormal; Notable for the following components:   Glucose-Capillary 493 (*)    All other components within normal limits  RESP PANEL BY RT-PCR (FLU A&B, COVID) ARPGX2  MAGNESIUM  OSMOLALITY  BETA-HYDROXYBUTYRIC ACID  BETA-HYDROXYBUTYRIC ACID    EKG None  Radiology CT Renal Stone Study  Result Date: 01/17/2021 CLINICAL DATA:  Flank pain. Kidney stone suspected. Dizziness. Frequent urination. Lower abdominal pain. EXAM: CT ABDOMEN AND PELVIS WITHOUT CONTRAST TECHNIQUE: Multidetector CT imaging of the abdomen and pelvis was  performed following the standard protocol without IV contrast. COMPARISON:  04/10/2019 FINDINGS: Lower chest: Normal Hepatobiliary: Diffuse fatty change of the liver. No focal lesion. No calcified gallstones. Pancreas:  Normal Spleen: Normal Adrenals/Urinary Tract: Adrenal glands are normal. Right kidney is normal. No cyst, mass, stone or hydronephrosis. Left kidney shows a 4 mm nonobstructing stone in the upper pole. No hydronephrosis. No passing stone. No stone in the bladder. Stomach/Bowel: Stomach and small intestine are normal. Normal appendix. No colon abnormality. Vascular/Lymphatic: Aorta and IVC are normal.  No adenopathy. Reproductive: Normal Other: No free fluid or air. Musculoskeletal: Ordinary mild lower lumbar degenerative changes. IMPRESSION: 4 mm nonobstructing stone in the upper pole of the left kidney. No hydronephrosis or passing stone. No stone in the bladder. Diffuse steatosis of the liver as seen previously. No focal or acute finding. Electronically Signed   By: Paulina Fusi M.D.   On: 01/17/2021 13:28    Procedures Procedures   Medications Ordered in ED Medications  lactated ringers bolus 2,340 mL (has no administration in time range)  insulin regular, human (MYXREDLIN) 100 units/ 100 mL infusion (has no administration in time range)  lactated ringers infusion (has no administration in time range)  dextrose 5 % in lactated ringers infusion (has no administration in time range)  dextrose 50 % solution 0-50 mL (has no administration in time range)  potassium chloride 10 mEq in 100 mL IVPB (has no administration in time range)    ED Course  I have reviewed the triage vital signs and the nursing notes.  Pertinent labs & imaging results that were available during my care of the patient were reviewed by me and considered in my medical decision making (see chart for details).    MDM Rules/Calculators/A&P                           Pt is in DKA and new onset DM.  Insulin drip  started.  Pt's K is 4.0, so KCl IV ordered.    Pt d/w Dr. Antionette Char ([facility]) for admission.  CRITICAL CARE Performed by: Jacalyn Lefevre   Total critical care time: 30 minutes  Critical care time was exclusive of separately billable procedures and treating other patients.  Critical care was necessary to treat or prevent imminent or life-threatening deterioration.  Critical care was time spent personally by me on the following activities: development of treatment plan with patient and/or surrogate as well as nursing, discussions with consultants, evaluation of patient's response to treatment, examination of patient, obtaining history from patient or surrogate, ordering and performing treatments and interventions, ordering and review of laboratory studies, ordering and review of radiographic studies, pulse oximetry and re-evaluation of patient's condition.  Final Clinical Impression(s) / ED Diagnoses Final diagnoses:  Diabetic ketoacidosis without coma associated with other specified diabetes mellitus [facility])    Rx / DC Orders ED Discharge Orders     None        Jacalyn Lefevre, MD 01/17/21 1912

## 2021-01-17 NOTE — ED Triage Notes (Signed)
Pt BIB [facility] from [facility]. Pt endorses dizziness and frequent urination since Saturday and bilateral lower abdominal pain that began yesterday.  CBG 473 BP 144/68 HR 93 98% RA  18G RAC

## 2021-01-18 DIAGNOSIS — E669 Obesity, unspecified: Secondary | ICD-10-CM | POA: Diagnosis present

## 2021-01-18 DIAGNOSIS — Z20822 Contact with and (suspected) exposure to covid-19: Secondary | ICD-10-CM | POA: Diagnosis present

## 2021-01-18 DIAGNOSIS — E1310 Other specified diabetes mellitus with ketoacidosis without coma: Secondary | ICD-10-CM

## 2021-01-18 DIAGNOSIS — E876 Hypokalemia: Secondary | ICD-10-CM | POA: Clinically undetermined

## 2021-01-18 DIAGNOSIS — R030 Elevated blood-pressure reading, without diagnosis of hypertension: Secondary | ICD-10-CM | POA: Diagnosis present

## 2021-01-18 DIAGNOSIS — Z6841 Body Mass Index (BMI) 40.0 and over, adult: Secondary | ICD-10-CM | POA: Diagnosis not present

## 2021-01-18 DIAGNOSIS — D72829 Elevated white blood cell count, unspecified: Secondary | ICD-10-CM | POA: Diagnosis present

## 2021-01-18 DIAGNOSIS — E1110 Type 2 diabetes mellitus with ketoacidosis without coma: Secondary | ICD-10-CM | POA: Diagnosis present

## 2021-01-18 DIAGNOSIS — F17220 Nicotine dependence, chewing tobacco, uncomplicated: Secondary | ICD-10-CM | POA: Diagnosis present

## 2021-01-18 LAB — BASIC METABOLIC PANEL
Anion gap: 13 (ref 5–15)
Anion gap: 6 (ref 5–15)
BUN: 15 mg/dL (ref 6–20)
BUN: 18 mg/dL (ref 6–20)
CO2: 18 mmol/L — ABNORMAL LOW (ref 22–32)
CO2: 26 mmol/L (ref 22–32)
Calcium: 8.9 mg/dL (ref 8.9–10.3)
Calcium: 9.5 mg/dL (ref 8.9–10.3)
Chloride: 101 mmol/L (ref 98–111)
Chloride: 103 mmol/L (ref 98–111)
Creatinine, Ser: 0.92 mg/dL (ref 0.61–1.24)
Creatinine, Ser: 1.19 mg/dL (ref 0.61–1.24)
GFR, Estimated: >60 mL/min (ref >60)
GFR, Estimated: >60 mL/min (ref >60)
Glucose, Bld: 132 mg/dL — ABNORMAL HIGH (ref 70–99)
Glucose, Bld: 249 mg/dL — ABNORMAL HIGH (ref 70–99)
Potassium: 2.8 mmol/L — ABNORMAL LOW (ref 3.5–5.1)
Potassium: 3.2 mmol/L — ABNORMAL LOW (ref 3.5–5.1)
Sodium: 132 mmol/L — ABNORMAL LOW (ref 135–145)
Sodium: 135 mmol/L (ref 135–145)

## 2021-01-18 LAB — GLUCOSE, CAPILLARY
Glucose-Capillary: 156 mg/dL — ABNORMAL HIGH (ref 70–99)
Glucose-Capillary: 166 mg/dL — ABNORMAL HIGH (ref 70–99)
Glucose-Capillary: 174 mg/dL — ABNORMAL HIGH (ref 70–99)
Glucose-Capillary: 184 mg/dL — ABNORMAL HIGH (ref 70–99)
Glucose-Capillary: 186 mg/dL — ABNORMAL HIGH (ref 70–99)
Glucose-Capillary: 191 mg/dL — ABNORMAL HIGH (ref 70–99)
Glucose-Capillary: 202 mg/dL — ABNORMAL HIGH (ref 70–99)
Glucose-Capillary: 207 mg/dL — ABNORMAL HIGH (ref 70–99)
Glucose-Capillary: 208 mg/dL — ABNORMAL HIGH (ref 70–99)
Glucose-Capillary: 208 mg/dL — ABNORMAL HIGH (ref 70–99)
Glucose-Capillary: 229 mg/dL — ABNORMAL HIGH (ref 70–99)
Glucose-Capillary: 236 mg/dL — ABNORMAL HIGH (ref 70–99)
Glucose-Capillary: 238 mg/dL — ABNORMAL HIGH (ref 70–99)
Glucose-Capillary: 243 mg/dL — ABNORMAL HIGH (ref 70–99)

## 2021-01-18 LAB — HEPATIC FUNCTION PANEL
ALT: 43 U/L (ref 0–44)
AST: 28 U/L (ref 15–41)
Albumin: 3.9 g/dL (ref 3.5–5.0)
Alkaline Phosphatase: 96 U/L (ref 38–126)
Bilirubin, Direct: 0.1 mg/dL (ref 0.0–0.2)
Indirect Bilirubin: 1.1 mg/dL — ABNORMAL HIGH (ref 0.3–0.9)
Total Bilirubin: 1.2 mg/dL (ref 0.3–1.2)
Total Protein: 7.2 g/dL (ref 6.5–8.1)

## 2021-01-18 LAB — CBC
HCT: 40.2 % (ref 39.0–52.0)
Hemoglobin: 14.6 g/dL (ref 13.0–17.0)
MCH: 30.7 pg (ref 26.0–34.0)
MCHC: 36.3 g/dL — ABNORMAL HIGH (ref 30.0–36.0)
MCV: 84.5 fL (ref 80.0–100.0)
Platelets: 403 10*3/uL — ABNORMAL HIGH (ref 150–400)
RBC: 4.76 MIL/uL (ref 4.22–5.81)
RDW: 11.9 % (ref 11.5–15.5)
WBC: 14.2 10*3/uL — ABNORMAL HIGH (ref 4.0–10.5)
nRBC: 0 % (ref 0.0–0.2)

## 2021-01-18 LAB — MRSA NEXT GEN BY PCR, NASAL: MRSA by PCR Next Gen: NOT DETECTED

## 2021-01-18 LAB — BETA-HYDROXYBUTYRIC ACID
Beta-Hydroxybutyric Acid: 0.35 mmol/L — ABNORMAL HIGH (ref 0.05–0.27)
Beta-Hydroxybutyric Acid: 2.54 mmol/L — ABNORMAL HIGH (ref 0.05–0.27)

## 2021-01-18 LAB — MAGNESIUM: Magnesium: 1.9 mg/dL (ref 1.7–2.4)

## 2021-01-18 LAB — HIV ANTIBODY (ROUTINE TESTING W REFLEX): HIV Screen 4th Generation wRfx: NONREACTIVE

## 2021-01-18 MED ORDER — INSULIN ASPART 100 UNIT/ML IJ SOLN
0.0000 [IU] | Freq: Three times a day (TID) | INTRAMUSCULAR | Status: DC
  Administered 2021-01-19 (×2): 5 [IU] via SUBCUTANEOUS

## 2021-01-18 MED ORDER — INSULIN GLARGINE-YFGN 100 UNIT/ML ~~LOC~~ SOLN
15.0000 [IU] | SUBCUTANEOUS | Status: DC
  Filled 2021-01-18: qty 0.15

## 2021-01-18 MED ORDER — SODIUM CHLORIDE 0.9 % IV BOLUS
1000.0000 mL | Freq: Once | INTRAVENOUS | Status: AC
  Administered 2021-01-18: 1000 mL via INTRAVENOUS

## 2021-01-18 MED ORDER — CHLORHEXIDINE GLUCONATE CLOTH 2 % EX PADS
6.0000 | MEDICATED_PAD | Freq: Every day | CUTANEOUS | Status: DC
  Administered 2021-01-18 – 2021-01-19 (×2): 6 via TOPICAL

## 2021-01-18 MED ORDER — INSULIN ASPART 100 UNIT/ML IJ SOLN
0.0000 [IU] | Freq: Every day | INTRAMUSCULAR | Status: DC
  Administered 2021-01-18: 2 [IU] via SUBCUTANEOUS

## 2021-01-18 MED ORDER — INSULIN GLARGINE-YFGN 100 UNIT/ML ~~LOC~~ SOLN
20.0000 [IU] | SUBCUTANEOUS | Status: DC
  Administered 2021-01-18: 20 [IU] via SUBCUTANEOUS
  Filled 2021-01-18 (×3): qty 0.2

## 2021-01-18 MED ORDER — INSULIN ASPART 100 UNIT/ML IJ SOLN: 0.0000 [IU] | Freq: Three times a day (TID) | INTRAMUSCULAR | Status: DC

## 2021-01-18 MED ORDER — PANTOPRAZOLE SODIUM 40 MG PO TBEC
40.0000 mg | DELAYED_RELEASE_TABLET | Freq: Every day | ORAL | Status: DC
  Administered 2021-01-18 – 2021-01-19 (×2): 40 mg via ORAL
  Filled 2021-01-18 (×2): qty 1

## 2021-01-18 MED ORDER — SODIUM CHLORIDE 0.9 % IV SOLN: INTRAVENOUS | Status: DC

## 2021-01-18 MED ORDER — GLIPIZIDE 10 MG PO TABS
10.0000 mg | ORAL_TABLET | Freq: Two times a day (BID) | ORAL | Status: DC
  Administered 2021-01-18 – 2021-01-19 (×3): 10 mg via ORAL
  Filled 2021-01-18 (×4): qty 1

## 2021-01-18 MED ORDER — INSULIN ASPART 100 UNIT/ML IJ SOLN
0.0000 [IU] | INTRAMUSCULAR | Status: DC
  Administered 2021-01-18: 3 [IU] via SUBCUTANEOUS

## 2021-01-18 MED ORDER — HYDRALAZINE HCL 20 MG/ML IJ SOLN: 10.0000 mg | Freq: Four times a day (QID) | INTRAMUSCULAR | Status: DC | PRN

## 2021-01-18 MED ORDER — POTASSIUM CHLORIDE CRYS ER 20 MEQ PO TBCR
40.0000 meq | EXTENDED_RELEASE_TABLET | ORAL | Status: AC
  Administered 2021-01-18 (×3): 40 meq via ORAL
  Filled 2021-01-18 (×3): qty 2

## 2021-01-18 NOTE — Plan of Care (Signed)
  Problem: Education: Goal: Knowledge of General Education information will improve Description: Including pain rating scale, medication(s)/side effects and non-pharmacologic comfort measures 01/18/2021 0210 by Leslye Peer, RN Outcome: Progressing 01/18/2021 0210 by Leslye Peer, RN Outcome: Not Progressing   Problem: Health Behavior/Discharge Planning: Goal: Ability to manage health-related needs will improve 01/18/2021 0210 by Leslye Peer, RN Outcome: Progressing 01/18/2021 0210 by Leslye Peer, RN Outcome: Not Progressing   Problem: Clinical Measurements: Goal: Ability to maintain clinical measurements within normal limits will improve 01/18/2021 0210 by Leslye Peer, RN Outcome: Progressing 01/18/2021 0210 by Leslye Peer, RN Outcome: Not Progressing Goal: Will remain free from infection 01/18/2021 0210 by Leslye Peer, RN Outcome: Progressing 01/18/2021 0210 by Leslye Peer, RN Outcome: Not Progressing Goal: Diagnostic test results will improve 01/18/2021 0210 by Leslye Peer, RN Outcome: Progressing 01/18/2021 0210 by Leslye Peer, RN Outcome: Not Progressing Goal: Respiratory complications will improve 01/18/2021 0210 by Leslye Peer, RN Outcome: Progressing 01/18/2021 0210 by Leslye Peer, RN Outcome: Not Progressing Goal: Cardiovascular complication will be avoided 01/18/2021 0210 by Leslye Peer, RN Outcome: Progressing 01/18/2021 0210 by Leslye Peer, RN Outcome: Not Progressing   Problem: Activity: Goal: Risk for activity intolerance will decrease 01/18/2021 0210 by Leslye Peer, RN Outcome: Progressing 01/18/2021 0210 by Leslye Peer, RN Outcome: Not Progressing   Problem: Nutrition: Goal: Adequate nutrition will be maintained 01/18/2021 0210 by Leslye Peer, RN Outcome: Progressing 01/18/2021 0210 by Leslye Peer, RN Outcome: Not Progressing   Problem: Coping: Goal: Level  of anxiety will decrease 01/18/2021 0210 by Leslye Peer, RN Outcome: Progressing 01/18/2021 0210 by Leslye Peer, RN Outcome: Not Progressing   Problem: Elimination: Goal: Will not experience complications related to bowel motility 01/18/2021 0210 by Leslye Peer, RN Outcome: Progressing 01/18/2021 0210 by Leslye Peer, RN Outcome: Not Progressing Goal: Will not experience complications related to urinary retention 01/18/2021 0210 by Leslye Peer, RN Outcome: Progressing 01/18/2021 0210 by Leslye Peer, RN Outcome: Not Progressing   Problem: Pain Managment: Goal: General experience of comfort will improve 01/18/2021 0210 by Leslye Peer, RN Outcome: Progressing 01/18/2021 0210 by Leslye Peer, RN Outcome: Not Progressing   Problem: Safety: Goal: Ability to remain free from injury will improve 01/18/2021 0210 by Leslye Peer, RN Outcome: Progressing 01/18/2021 0210 by Leslye Peer, RN Outcome: Not Progressing   Problem: Skin Integrity: Goal: Risk for impaired skin integrity will decrease 01/18/2021 0210 by Leslye Peer, RN Outcome: Progressing 01/18/2021 0210 by Leslye Peer, RN Outcome: Not Progressing

## 2021-01-18 NOTE — Progress Notes (Addendum)
PROGRESS NOTE    Ronald Palmer  EYC:[id number] DOB: 1975/05/28 DOA: 01/17/2021 PCP: Gwenlyn Fudge, FNP    Chief Complaint  Patient presents with   Dizziness   Urinary Frequency    Brief Narrative: Patient is a 45 year old gentleman history of nephrolithiasis, BMI of 42, presenting to the ED for lightheadedness, polyuria and abdominal discomfort.  EKG done within normal sinus rhythm.  CT renal stone protocol done with a 4 mm nonobstructing stone in upper pole of the left kidney without hydronephrosis or acute findings.  Hepatic steatosis also noted on CT.  Lab work consistent with newly diagnosed diabetes mellitus with DKA and patient placed on the Endo tool as well as IV fluids, electrolyte repletion.   Assessment & Plan:   Principal Problem:   DKA (diabetic ketoacidosis) (HCC) Active Problems:   Newly diagnosed diabetes (HCC)   Hypokalemia   Obesity (BMI 30-39.9)   Leukocytosis   Elevated blood pressure reading   #1 DKA/newly diagnosed diabetes mellitus 2 -Patient presented with polyuria, polydipsia, lightheadedness, abdominal discomfort. -CT renal stone protocol done with 4 mm nonobstructing stone in the upper pole of the left kidney without hydronephrosis or acute findings. -Urinalysis negative for infection.  Patient afebrile.  Patient with no respiratory symptoms.  EKG done with normal sinus rhythm with no ischemic changes noted. -Hemoglobin A1c 11.7 (01/17/2021) -On presentation patient noted to have glycosuria, ketonuria.  Elevated beta hydroxy butyrate. -Patient noted to be acidotic on presentation with bicarb of 14, glucose of 494, with an anion gap of 20. -Patient currently on Endo tool/insulin drip, anion gap closed, acidosis resolved. -Transition from insulin drip/Endo to 2 Semglee 20 units daily subcutaneously, SSI.  Also placed on glipizide 10 mg twice daily. 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 -Continue IV fluids. -Advance diet to a carb modified  diet. -Diabetes coordinator consulted and following.  2.  Obesity -Lifestyle modification. -Outpatient follow-up with PCP.  3.  Elevated blood pressure -Patient with no prior history of hypertension, blood pressure noted to be elevated. -Monitor for now. -Hydralazine as needed.  4.  Hypokalemia -Potassium at 2.8 this morning. -Magnesium at 1.9. -K-Dur 40 mEq p.o. every 4 hours x3 doses. -Repeat labs in the morning.  5.  Leukocytosis -Likely reactive leukocytosis. -Patient with no signs or symptoms of infection. -No need for antibiotics at this time. -Repeat labs in the AM.   DVT prophylaxis: Lovenox Code Status: Full Family Communication: Updated patient.  No family at bedside. Disposition:   Status is: Observation  The patient remains OBS appropriate and will d/c before 2 midnights.      Consultants:  None  Procedures:  CT renal stone protocol 01/17/2021    Antimicrobials:  None   Subjective: Patient laying in bed just woken up.  Complain of hunger pains.  States feels like "blaah".  No nausea or vomiting.  No chest pain.  No abdominal pain.  States just woke up.  Objective: Vitals:   01/18/21 0700 01/18/21 0800 01/18/21 0900 01/18/21 1000  BP: (!) 124/108 114/68 (!) 146/82 (!) 153/96  Pulse: 65 71 (!) 55 63  Resp: 17 13 14 15   Temp:  97.8 F (36.6 C)    TempSrc:  Oral    SpO2: 98% 95% 96% 95%  Weight:      Height:        Intake/Output Summary (Last 24 hours) at 01/18/2021 1048 Last data filed at 01/18/2021 0909 Gross per 24 hour  Intake 3014.03 ml  Output --  Net  3014.03 ml   Filed Weights   01/17/21 1833 01/18/21 0000  Weight: 117 kg 110.9 kg    Examination:  General exam: Appears calm and comfortable  Respiratory system: Clear to auscultation. Respiratory effort normal. Cardiovascular system: S1 & S2 heard, RRR. No JVD, murmurs, rubs, gallops or clicks. No pedal edema. Gastrointestinal system: Abdomen is nondistended, soft and  nontender. No organomegaly or masses felt. Normal bowel sounds heard. Central nervous system: Alert and oriented. No focal neurological deficits. Extremities: Symmetric 5 x 5 power. Skin: No rashes, lesions or ulcers Psychiatry: Judgement and insight appear normal. Mood & affect appropriate.     Data Reviewed: I have personally reviewed following labs and imaging studies  CBC: Recent Labs  Lab 01/17/21 1235 01/18/21 0004  WBC 12.6* 14.2*  NEUTROABS 8.9*  --   HGB 15.7 14.6  HCT 43.8 40.2  MCV 85.2 84.5  PLT 429* 403*    Basic Metabolic Panel: Recent Labs  Lab 01/17/21 1235 01/17/21 1930 01/18/21 0004 01/18/21 0807  NA 129* 129* 132* 135  K 4.0 4.4 3.2* 2.8*  CL 93* 95* 101 103  CO2 17* 14* 18* 26  GLUCOSE 450* 494* 249* 132*  BUN CREATININE 1.23 1.33* 1.19 0.92  CALCIUM 9.6 9.4 9.5 8.9  MG 2.2  --   --  1.9    GFR: Estimated Creatinine Clearance: 118.5 mL/min (by C-G formula based on SCr of 0.92 mg/dL).  Liver Function Tests: Recent Labs  Lab 01/17/21 1235 01/18/21 0004  AST 25 28  ALT 45* 43  ALKPHOS 104 96  BILITOT 1.7* 1.2  PROT 7.9 7.2  ALBUMIN 4.3 3.9    CBG: Recent Labs  Lab 01/18/21 0457 01/18/21 0635 01/18/21 0743 01/18/21 0851 01/18/21 1000  GLUCAP 207* 191* 166* 156* 174*     Recent Results (from the past 240 hour(s))  Resp Panel by RT-PCR (Flu A&B, Covid) Nasopharyngeal Swab     Status: None   Collection Time: 01/17/21  7:38 PM   Specimen: Nasopharyngeal Swab; Nasopharyngeal(NP) swabs in vial transport medium  Result Value Ref Range Status   SARS Coronavirus 2 by RT PCR NEGATIVE NEGATIVE Final    Comment: (NOTE) SARS-CoV-2 target nucleic acids are NOT DETECTED.  The SARS-CoV-2 RNA is generally detectable in upper respiratory specimens during the acute phase of infection. The lowest concentration of SARS-CoV-2 viral copies this assay can detect is 138 copies/mL. A negative result does not preclude  SARS-Cov-2 infection and should not be used as the sole basis for treatment or other patient management decisions. A negative result may occur with  improper specimen collection/handling, submission of specimen other than nasopharyngeal swab, presence of viral mutation(s) within the areas targeted by this assay, and inadequate number of viral copies(<138 copies/mL). A negative result must be combined with clinical observations, patient history, and epidemiological information. The expected result is Negative.  Fact Sheet for Patients:  BloggerCourse.com  Fact Sheet for Healthcare Providers:  SeriousBroker.it  This test is no t yet approved or cleared by the [place] FDA and  has been authorized for detection and/or diagnosis of SARS-CoV-2 by FDA under an Emergency Use Authorization (EUA). This EUA will remain  in effect (meaning this test can be used) for the duration of the COVID-19 declaration under Section 564(b)(1) of the Act, 21 U.S.C.section 360bbb-3(b)(1), unless the authorization is terminated  or revoked sooner.       Influenza A by PCR NEGATIVE NEGATIVE Final   Influenza B  by PCR NEGATIVE NEGATIVE Final    Comment: (NOTE) The Xpert Xpress SARS-CoV-2/FLU/RSV plus assay is intended as an aid in the diagnosis of influenza from Nasopharyngeal swab specimens and should not be used as a sole basis for treatment. Nasal washings and aspirates are unacceptable for Xpert Xpress SARS-CoV-2/FLU/RSV testing.  Fact Sheet for Patients: BloggerCourse.com  Fact Sheet for Healthcare Providers: SeriousBroker.it  This test is not yet approved or cleared by the [place] FDA and has been authorized for detection and/or diagnosis of SARS-CoV-2 by FDA under an Emergency Use Authorization (EUA). This EUA will remain in effect (meaning this test can be used) for the duration of  the COVID-19 declaration under Section 564(b)(1) of the Act, 21 U.S.C. section 360bbb-3(b)(1), unless the authorization is terminated or revoked.  Performed at [facility], [address] [place], [place] [address]   MRSA Next Gen by PCR, Nasal     Status: None   Collection Time: 01/18/21 12:05 AM   Specimen: Nasal Mucosa; Nasal Swab  Result Value Ref Range Status   MRSA by PCR Next Gen NOT DETECTED NOT DETECTED Final    Comment: (NOTE) The GeneXpert MRSA Assay (FDA approved for NASAL specimens only), is one component of a comprehensive MRSA colonization surveillance program. It is not intended to diagnose MRSA infection nor to guide or monitor treatment for MRSA infections. Test performance is not FDA approved in patients less than 7 years old. Performed at [facility], [address] [place], [place] [address]          Radiology Studies: CT Renal Stone Study  Result Date: 01/17/2021 CLINICAL DATA:  Flank pain. Kidney stone suspected. Dizziness. Frequent urination. Lower abdominal pain. EXAM: CT ABDOMEN AND PELVIS WITHOUT CONTRAST TECHNIQUE: Multidetector CT imaging of the abdomen and pelvis was performed following the standard protocol without IV contrast. COMPARISON:  04/10/2019 FINDINGS: Lower chest: Normal Hepatobiliary: Diffuse fatty change of the liver. No focal lesion. No calcified gallstones. Pancreas: Normal Spleen: Normal Adrenals/Urinary Tract: Adrenal glands are normal. Right kidney is normal. No cyst, mass, stone or hydronephrosis. Left kidney shows a 4 mm nonobstructing stone in the upper pole. No hydronephrosis. No passing stone. No stone in the bladder. Stomach/Bowel: Stomach and small intestine are normal. Normal appendix. No colon abnormality. Vascular/Lymphatic: Aorta and IVC are normal.  No adenopathy. Reproductive: Normal Other: No free fluid or air. Musculoskeletal: Ordinary mild lower lumbar degenerative changes.  IMPRESSION: 4 mm nonobstructing stone in the upper pole of the left kidney. No hydronephrosis or passing stone. No stone in the bladder. Diffuse steatosis of the liver as seen previously. No focal or acute finding. Electronically Signed   By: Paulina Fusi M.D.   On: 01/17/2021 13:28        Scheduled Meds:  Chlorhexidine Gluconate Cloth  6 each Topical Daily   enoxaparin (LOVENOX) injection  40 mg Subcutaneous Q24H   glipiZIDE  10 mg Oral BID AC   insulin aspart  0-15 Units Subcutaneous Q4H   insulin glargine-yfgn  20 Units Subcutaneous Q24H   pantoprazole  40 mg Oral Q0600   potassium chloride  40 mEq Oral Q4H   Continuous Infusions:  sodium chloride     dextrose 5% lactated ringers 125 mL/hr at 01/18/21 0909   insulin 5.5 Units/hr (01/18/21 1004)   lactated ringers Stopped (01/18/21 0036)     LOS: 0 days    Time spent: 40 minutes    Ramiro Harvest, MD [facility]   To  contact the attending provider between 7A-7P or the covering provider during after hours 7P-7A, please log into the web site [email or web address] and access using universal [place] password for that web site. If you do not have the password, please call the hospital operator.  01/18/2021, 10:48 AM

## 2021-01-18 NOTE — Progress Notes (Signed)
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 Grandville Silos) -   Novolog 0-15 units tid + hs -   Glipizide 10 mg bid -   Metformin 500 mg bid for now and titrate instructions for d/c.  Spoke with pt at bedside regarding new diagnosis of DM and possible insulin at d/c. Pt has diabetes that runs on his father's side of the family. Pt is a single father. 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 Pt report recent weight loss. Discussed lifestyle modifications with diet, beverage options, and exercise. Discussed how to check and when to check glucose at home. Showed pt how to operate insulin pen. Discussed the different sites insulin can be injected.    D/c planning/needs projected: Insulin pen needles order # [id number] Lantus solostar insulin pen order # [phone]  (covered by insurance) Glucose meter kit order # [id number] Glipizide 10 mg bid Metformin 500 mg bid for 1 week then increase to 1000 mg bid  Will evaluate fasting glucose in the am to determine medications for d/c.   Thanks,  Tama Headings RN, MSN,  BC-ADM Inpatient Diabetes Coordinator Team Pager [phone] (8a-5p)

## 2021-01-18 NOTE — Progress Notes (Signed)
20 units semglee given per orders. This RN will continue to follow endo tool instructions for 2 hrs per protocol before discontinuing insulin gtt.

## 2021-01-18 NOTE — Progress Notes (Signed)
Report given to Miah RN on 4E.  Pt transported via W/C.  Pt A&O x4, denies c/o pain or respiratory distress during transfer.  All belongings transferred with pt.  Orders for insulin and POC discussed with NP and orders changed to AC/HS coverage and POC, with POC at 3am.

## 2021-01-19 LAB — BASIC METABOLIC PANEL
Anion gap: 9 (ref 5–15)
BUN: 12 mg/dL (ref 6–20)
CO2: 18 mmol/L — ABNORMAL LOW (ref 22–32)
Calcium: 8.5 mg/dL — ABNORMAL LOW (ref 8.9–10.3)
Chloride: 107 mmol/L (ref 98–111)
Creatinine, Ser: 0.84 mg/dL (ref 0.61–1.24)
GFR, Estimated: >60 mL/min (ref >60)
Glucose, Bld: 261 mg/dL — ABNORMAL HIGH (ref 70–99)
Potassium: 3.6 mmol/L (ref 3.5–5.1)
Sodium: 134 mmol/L — ABNORMAL LOW (ref 135–145)

## 2021-01-19 LAB — GLUCOSE, CAPILLARY
Glucose-Capillary: 264 mg/dL — ABNORMAL HIGH (ref 70–99)
Glucose-Capillary: 237 mg/dL — ABNORMAL HIGH (ref 70–99)
Glucose-Capillary: 248 mg/dL — ABNORMAL HIGH (ref 70–99)

## 2021-01-19 LAB — CBC WITH DIFFERENTIAL/PLATELET
Abs Immature Granulocytes: 0.04 10*3/uL (ref 0.00–0.07)
Basophils Absolute: 0.1 10*3/uL (ref 0.0–0.1)
Basophils Relative: 1 %
Eosinophils Absolute: 0.2 10*3/uL (ref 0.0–0.5)
Eosinophils Relative: 2 %
HCT: 36.9 % — ABNORMAL LOW (ref 39.0–52.0)
Hemoglobin: 12.8 g/dL — ABNORMAL LOW (ref 13.0–17.0)
Immature Granulocytes: 1 %
Lymphocytes Relative: 39 %
Lymphs Abs: 2.8 10*3/uL (ref 0.7–4.0)
MCH: 30.7 pg (ref 26.0–34.0)
MCHC: 34.7 g/dL (ref 30.0–36.0)
MCV: 88.5 fL (ref 80.0–100.0)
Monocytes Absolute: 0.5 10*3/uL (ref 0.1–1.0)
Monocytes Relative: 7 %
Neutro Abs: 3.5 10*3/uL (ref 1.7–7.7)
Neutrophils Relative %: 50 %
Platelets: 290 10*3/uL (ref 150–400)
RBC: 4.17 MIL/uL — ABNORMAL LOW (ref 4.22–5.81)
RDW: 12.4 % (ref 11.5–15.5)
WBC: 7.1 10*3/uL (ref 4.0–10.5)
nRBC: 0 % (ref 0.0–0.2)

## 2021-01-19 MED ORDER — METFORMIN HCL 500 MG PO TABS: 500.0000 mg | ORAL_TABLET | Freq: Two times a day (BID) | ORAL | 1 refills | Status: DC

## 2021-01-19 MED ORDER — BLOOD GLUCOSE METER KIT: PACK | 0 refills | Status: DC

## 2021-01-19 MED ORDER — LANTUS SOLOSTAR 100 UNIT/ML ~~LOC~~ SOPN: 25.0000 [IU] | PEN_INJECTOR | Freq: Every day | SUBCUTANEOUS | 0 refills | Status: DC

## 2021-01-19 MED ORDER — SODIUM BICARBONATE 650 MG PO TABS: 650.0000 mg | ORAL_TABLET | Freq: Three times a day (TID) | ORAL | 0 refills | Status: AC

## 2021-01-19 MED ORDER — PANTOPRAZOLE SODIUM 40 MG PO TBEC: 40.0000 mg | DELAYED_RELEASE_TABLET | Freq: Every day | ORAL | 1 refills | Status: DC

## 2021-01-19 MED ORDER — METFORMIN HCL 500 MG PO TABS
500.0000 mg | ORAL_TABLET | Freq: Two times a day (BID) | ORAL | Status: DC
  Administered 2021-01-19: 500 mg via ORAL
  Filled 2021-01-19: qty 1

## 2021-01-19 MED ORDER — INSULIN GLARGINE-YFGN 100 UNIT/ML ~~LOC~~ SOLN
24.0000 [IU] | SUBCUTANEOUS | Status: DC
  Administered 2021-01-19: 24 [IU] via SUBCUTANEOUS
  Filled 2021-01-19: qty 0.24

## 2021-01-19 MED ORDER — INSULIN PEN NEEDLE 32G X 4 MM MISC: 25.0000 [IU] | Freq: Every day | 0 refills | Status: DC

## 2021-01-19 MED ORDER — GLIPIZIDE 10 MG PO TABS: 10.0000 mg | ORAL_TABLET | Freq: Two times a day (BID) | ORAL | 1 refills | Status: DC

## 2021-01-19 MED ORDER — SODIUM BICARBONATE 650 MG PO TABS
650.0000 mg | ORAL_TABLET | Freq: Three times a day (TID) | ORAL | Status: DC
  Administered 2021-01-19: 650 mg via ORAL
  Filled 2021-01-19: qty 1

## 2021-01-19 MED ORDER — POTASSIUM CHLORIDE CRYS ER 10 MEQ PO TBCR
40.0000 meq | EXTENDED_RELEASE_TABLET | Freq: Once | ORAL | Status: AC
  Administered 2021-01-19: 40 meq via ORAL
  Filled 2021-01-19: qty 4

## 2021-01-19 NOTE — Discharge Summary (Signed)
Physician Discharge Summary  Ronald Palmer AJO:[id number] DOB: 06-22-1975 DOA: 01/17/2021  PCP: Loman Brooklyn, FNP  Admit date: 01/17/2021 Discharge date: 01/19/2021  Time spent: 55 minutes  Recommendations for Outpatient Follow-up:  Follow-up with Loman Brooklyn, FNP in 1 to 2 weeks.  On follow-up patient's diabetes will need to be reassessed and further management done at that time.  Patient will also need a basic metabolic profile done to follow-up on electrolytes and renal function.  Patient would likely also need outpatient referral to ophthalmology for yearly eye exams.   Discharge Diagnoses:  Principal Problem:   DKA (diabetic ketoacidosis) ([place]) Active Problems:   Newly diagnosed diabetes ([place])   Hypokalemia   Obesity (BMI 30-39.9)   Leukocytosis   Elevated blood pressure reading   Discharge Condition: Stable and improved  Diet recommendation: Carb modified diet  Filed Weights   01/17/21 1833 01/18/21 0000 01/18/21 2039  Weight: 117 kg 110.9 kg 118.9 kg    History of present illness:  HPI per Dr. Vito Backers Talerico is a pleasant 45 y.o. male with medical history significant for nephrolithiasis and BMI 42, now presenting to the emergency department for evaluation of lightheadedness, polyuria, and abdominal discomfort.  Patient reports he been in his usual state of health until noting some blurred vision on 01/14/2021.  He also developed polyuria, polydipsia, and indigestion around the same time.  Symptoms have persisted and he has also developed some abdominal discomfort.  Denies any fevers, chills, cough, or chest pain.   ED Course: Upon arrival to the ED, patient is found to be afebrile, saturating well on room air, and with stable blood pressure.  EKG features sinus rhythm.  CT renal stone study reveals 4 mm nonobstructive stone in the upper pole of the left kidney without hydronephrosis or other acute findings.  Diffuse hepatic steatosis again noted on CT.  Chemistry panel  features a glucose of 450, bicarbonate 17, and anion gap 19.  ALT and total bilirubin were slightly elevated.  There is a leukocytosis to 12,600 and mild thrombocytosis on CBC.  Urine ketones were noted.  Patient was given 2.3 L of LR, 20 mEq IV potassium, and started on insulin infusion in the ED.  Hospital Course:  #1 DKA/newly diagnosed diabetes mellitus 2 -Patient presented with polyuria, polydipsia, lightheadedness, abdominal discomfort, blurry vision. -CT renal stone protocol done with 4 mm nonobstructing stone in the upper pole of the left kidney without hydronephrosis or acute findings. -Urinalysis negative for infection.  Patient afebrile.  Patient with no respiratory symptoms.  EKG done with normal sinus rhythm with no ischemic changes noted. -Hemoglobin A1c 11.7 (01/17/2021) -On presentation patient noted to have glycosuria, ketonuria.  Elevated beta hydroxy butyrate. -Patient noted to be acidotic on presentation with bicarb of 14, glucose of 494, with an anion gap of 20. -Patient placed on aggressive fluid resuscitation, and due to/insulin drip with clinical improvement, resolution of acidosis, anion gap closed.  -Patient subsequently transitioned to subcutaneous insulin and started on glipizide 10 mg twice daily as well as metformin 500 mg twice daily.  -Patient seen by diabetic coordinator during this hospitalization.   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 -Patient will be discharged on Lantus Solostar insulin pen 25 units daily, glipizide 10 mg twice daily, metformin 500 mg twice daily.   -Patient will need close outpatient follow-up with PCP 1 to 2 weeks post discharge for further evaluation  and management.    2.  Obesity -Lifestyle modification. -Outpatient follow-up with PCP.  3.  Elevated blood pressure -Patient with no prior history of hypertension, blood pressure noted to be elevated. -Elevated blood  pressure had resolved by day of discharge. -On day of discharge blood pressure is 116/72. -Outpatient follow-up.  4.  Hypokalemia -Likely secondary to DKA with fluid shifts.   -Repleted.   -Outpatient follow-up.    5.  Leukocytosis -Likely reactive leukocytosis. -Patient with no signs or symptoms of infection. -Repeat labs on day of discharge with resolution of leukocytosis. -Outpatient follow-up with PCP.  Procedures: CT renal stone protocol 01/17/2021  Consultations: None  Discharge Exam: Vitals:   01/18/21 2039 01/19/21 0549  BP: 125/90 116/72  Pulse: 73 68  Resp: 18 16  Temp: 98.4 F (36.9 C) 97.9 F (36.6 C)  SpO2: 100% 93%    General: NAD Cardiovascular: Regular rate rhythm no murmurs rubs or gallops.  No JVD.  No lower extremity edema. Respiratory: Lungs clear to auscultation bilaterally.  Discharge Instructions   Discharge Instructions     Diet Carb Modified   Complete by: As directed    Increase activity slowly   Complete by: As directed       Allergies as of 01/19/2021   No Known Allergies      Medication List     STOP taking these medications    ciprofloxacin 500 MG tablet Commonly known as: CIPRO   oxyCODONE-acetaminophen 5-325 MG tablet Commonly known as: PERCOCET/ROXICET       TAKE these medications    blood glucose meter kit and supplies Dispense based on patient and insurance preference. Use up to four times daily as directed. (FOR ICD-10 E10.9, E11.9).   glipiZIDE 10 MG tablet Commonly known as: GLUCOTROL Take 1 tablet (10 mg total) by mouth 2 (two) times daily before a meal.   Insulin Pen Needle 32G X 4 MM Misc 25 Units by Does not apply route daily.   Lantus SoloStar 100 UNIT/ML Solostar Pen Generic drug: insulin glargine Inject 25 Units into the skin daily.   metFORMIN 500 MG tablet Commonly known as: GLUCOPHAGE Take 1 tablet (500 mg total) by mouth 2 (two) times daily with a meal.   pantoprazole 40 MG  tablet Commonly known as: PROTONIX Take 1 tablet (40 mg total) by mouth daily at 6 (six) AM. Start taking on: January 20, 2021   sodium bicarbonate 650 MG tablet Take 1 tablet (650 mg total) by mouth 3 (three) times daily for 3 days.   tamsulosin 0.4 MG Caps capsule Commonly known as: FLOMAX Take 1 capsule (0.4 mg total) by mouth daily.       No Known Allergies  Follow-up Information     [facility] Follow up on 03/23/2021.   Why: time is at 0850am please call [facility] for provider before then. Contact information: [address] [place] [address] [phone]        PCP. Schedule an appointment as soon as possible for a visit in 2 week(s).   Why: Follow-up in 1 to 2 weeks.                 The results of significant diagnostics from this hospitalization (including imaging, microbiology, ancillary and laboratory) are listed below for reference.    Significant Diagnostic Studies: CT Renal Stone Study  Result Date: 01/17/2021 CLINICAL DATA:  Flank pain. Kidney stone suspected. Dizziness. Frequent urination. Lower abdominal pain. EXAM:  CT ABDOMEN AND PELVIS WITHOUT CONTRAST TECHNIQUE: Multidetector CT imaging of the abdomen and pelvis was performed following the standard protocol without IV contrast. COMPARISON:  04/10/2019 FINDINGS: Lower chest: Normal Hepatobiliary: Diffuse fatty change of the liver. No focal lesion. No calcified gallstones. Pancreas: Normal Spleen: Normal Adrenals/Urinary Tract: Adrenal glands are normal. Right kidney is normal. No cyst, mass, stone or hydronephrosis. Left kidney shows a 4 mm nonobstructing stone in the upper pole. No hydronephrosis. No passing stone. No stone in the bladder. Stomach/Bowel: Stomach and small intestine are normal. Normal appendix. No colon abnormality. Vascular/Lymphatic: Aorta and IVC are normal.  No adenopathy. Reproductive: Normal Other: No free fluid or air.  Musculoskeletal: Ordinary mild lower lumbar degenerative changes. IMPRESSION: 4 mm nonobstructing stone in the upper pole of the left kidney. No hydronephrosis or passing stone. No stone in the bladder. Diffuse steatosis of the liver as seen previously. No focal or acute finding. Electronically Signed   By: Nelson Chimes M.D.   On: 01/17/2021 13:28    Microbiology: Recent Results (from the past 240 hour(s))  Resp Panel by RT-PCR (Flu A&B, Covid) Nasopharyngeal Swab     Status: None   Collection Time: 01/17/21  7:38 PM   Specimen: Nasopharyngeal Swab; Nasopharyngeal(NP) swabs in vial transport medium  Result Value Ref Range Status   SARS Coronavirus 2 by RT PCR NEGATIVE NEGATIVE Final    Comment: (NOTE) SARS-CoV-2 target nucleic acids are NOT DETECTED.  The SARS-CoV-2 RNA is generally detectable in upper respiratory specimens during the acute phase of infection. The lowest concentration of SARS-CoV-2 viral copies this assay can detect is 138 copies/mL. A negative result does not preclude SARS-Cov-2 infection and should not be used as the sole basis for treatment or other patient management decisions. A negative result may occur with  improper specimen collection/handling, submission of specimen other than nasopharyngeal swab, presence of viral mutation(s) within the areas targeted by this assay, and inadequate number of viral copies(<138 copies/mL). A negative result must be combined with clinical observations, patient history, and epidemiological information. The expected result is Negative.  Fact Sheet for Patients:  EntrepreneurPulse.com.au  Fact Sheet for Healthcare Providers:  IncredibleEmployment.be  This test is no t yet approved or cleared by the [place] FDA and  has been authorized for detection and/or diagnosis of SARS-CoV-2 by FDA under an Emergency Use Authorization (EUA). This EUA will remain  in effect (meaning this test can be  used) for the duration of the COVID-19 declaration under Section 564(b)(1) of the Act, 21 U.S.C.section 360bbb-3(b)(1), unless the authorization is terminated  or revoked sooner.       Influenza A by PCR NEGATIVE NEGATIVE Final   Influenza B by PCR NEGATIVE NEGATIVE Final    Comment: (NOTE) The Xpert Xpress SARS-CoV-2/FLU/RSV plus assay is intended as an aid in the diagnosis of influenza from Nasopharyngeal swab specimens and should not be used as a sole basis for treatment. Nasal washings and aspirates are unacceptable for Xpert Xpress SARS-CoV-2/FLU/RSV testing.  Fact Sheet for Patients: EntrepreneurPulse.com.au  Fact Sheet for Healthcare Providers: IncredibleEmployment.be  This test is not yet approved or cleared by the [place] FDA and has been authorized for detection and/or diagnosis of SARS-CoV-2 by FDA under an Emergency Use Authorization (EUA). This EUA will remain in effect (meaning this test can be used) for the duration of the COVID-19 declaration under Section 564(b)(1) of the Act, 21 U.S.C. section 360bbb-3(b)(1), unless the authorization is terminated or revoked.  Performed at [facility], [place] [address] [place], [place] [address]   MRSA Next Gen by PCR, Nasal     Status: None   Collection Time: 01/18/21 12:05 AM   Specimen: Nasal Mucosa; Nasal Swab  Result Value Ref Range Status   MRSA by PCR Next Gen NOT DETECTED NOT DETECTED Final    Comment: (NOTE) The GeneXpert MRSA Assay (FDA approved for NASAL specimens only), is one component of a comprehensive MRSA colonization surveillance program. It is not intended to diagnose MRSA infection nor to guide or monitor treatment for MRSA infections. Test performance is not FDA approved in patients less than 31 years old. Performed at [facility], [place] [address], [place], [place] [address]      Labs: Basic Metabolic Panel: Recent  Labs  Lab 01/17/21 1235 01/17/21 1930 01/18/21 0004 01/18/21 0807 01/19/21 0453  NA 129* 129* 132* 135 134*  K 4.0 4.4 3.2* 2.8* 3.6  CL 93* 95* 101 103 107  CO2 17* 14* 18* 26 18*  GLUCOSE 450* 494* 249* 132* 261*  BUN _0 CREATININE 1.23 1.33* 1.19 0.92 0.84  CALCIUM 9.6 9.4 9.5 8.9 8.5*  MG 2.2  --   --  1.9  --    Liver Function Tests: Recent Labs  Lab 01/17/21 1235 01/18/21 0004  AST 25 28  ALT 45* 43  ALKPHOS 104 96  BILITOT 1.7* 1.2  PROT 7.9 7.2  ALBUMIN 4.3 3.9   Recent Labs  Lab 01/17/21 1235  LIPASE 52*   No results for input(s): AMMONIA in the last 168 hours. CBC: Recent Labs  Lab 01/17/21 1235 01/18/21 0004 01/19/21 0453  WBC 12.6* 14.2* 7.1  NEUTROABS 8.9*  --  3.5  HGB 15.7 14.6 12.8*  HCT 43.8 40.2 36.9*  MCV 85.2 84.5 88.5  PLT 429* 403* 290   Cardiac Enzymes: No results for input(s): CKTOTAL, CKMB, CKMBINDEX, TROPONINI in the last 168 hours. BNP: BNP (last 3 results) No results for input(s): BNP in the last 8760 hours.  ProBNP (last 3 results) No results for input(s): PROBNP in the last 8760 hours.  CBG: Recent Labs  Lab 01/18/21 1252 01/18/21 1536 01/18/21 1943 01/19/21 0323 01/19/21 0722  GLUCAP 202* 186* 238* 264* 237*       Signed:  Irine Seal MD.  [facility] 01/19/2021, 11:33 AM

## 2021-01-19 NOTE — Progress Notes (Signed)
Spoke with patient at the bedside in regards to insulin and meds to be discharged on. Reviewed time for taking insulin and when to check blood sugars during the day. Reminded him to be sure to follow up with PCP after discharge. Patient understanding information and feels comfortable with the insulin pen usage.  Sandate Mince RN BSN CDE Diabetes Coordinator Pager: [phone]  8am-5pm

## 2021-01-19 NOTE — TOC Transition Note (Signed)
Transition of Care [facility]) - CM/SW Discharge Note   Patient Details  Name: Ronald Palmer MRN: [id number] Date of Birth: [birth year]  Transition of Care [facility]) CM/SW Contact:  Golda Acre, RN Phone Number: 01/19/2021, 12:15 PM   Clinical Narrative:    Pt being dcd to home with self care. Has f/u appointment with [facility] on [phone] at [phone].  Dc instructions will give reminder to patient.   Final next level of care: Home/Self Care Barriers to Discharge: No Barriers Identified   Patient Goals and CMS Choice Patient states their goals for this hospitalization and ongoing recovery are:: to return to my home CMS Medicare.gov Compare Post Acute Care list provided to:: Patient    Discharge Placement                       Discharge Plan and Services   Discharge Planning Services: CM Consult, Follow-up appt scheduled                                 Social Determinants of Health (SDOH) Interventions     Readmission Risk Interventions No flowsheet data found.

## 2021-01-19 NOTE — Progress Notes (Signed)
Pt to be discharged to home this afternoon. Pt given discharge teaching including all discharge Medications and schedules for these Medications. The Diabetes Coordinator has been in today and reviewed Diabetes teaching with this Patient. RN reinforced teaching. Pt verbalized understanding of all discharge teaching. Discharge AVS along with printed Prescriptions with the Patient at time of discharge.

## 2021-01-19 NOTE — TOC Initial Note (Signed)
Transition of Care [facility]) - Initial/Assessment Note    Patient Details  Name: Ronald Palmer MRN: [id number] Date of Birth: [birth year]  Transition of Care [facility] CM/SW Contact:    Golda Acre, RN Phone Number: 01/19/2021, 7:50 AM  Clinical Narrative:                 New diabetic-dka bld glucose ranging at [phone], ic fkds at 125cc/hr, Plan: will need to be seen by the diabetic coordinator, needs pcp-will arrange f/u visit with clinic/has insurance can afford meds, home with self cared. Lives alone has family in the area.  Expected Discharge Plan: Home/Self Care Barriers to Discharge: Continued Medical Work up   Patient Goals and CMS Choice Patient states their goals for this hospitalization and ongoing recovery are:: to return to my home CMS Medicare.gov Compare Post Acute Care list provided to:: Patient    Expected Discharge Plan and Services Expected Discharge Plan: Home/Self Care   Discharge Planning Services: CM Consult   Living arrangements for the past 2 months: Single Family Home                                      Prior Living Arrangements/Services Living arrangements for the past 2 months: Single Family Home Lives with:: Self Patient language and need for interpreter reviewed:: Yes Do you feel safe going back to the place where you live?: Yes            Criminal Activity/Legal Involvement Pertinent to Current Situation/Hospitalization: No - Comment as needed  Activities of Daily Living Home Assistive Devices/Equipment: None ADL Screening (condition at time of admission) Patient's cognitive ability adequate to safely complete daily activities?: Yes Is the patient deaf or have difficulty hearing?: No Does the patient have difficulty seeing, even when wearing glasses/contacts?: Yes (blurry vision the last couple of days) Does the patient have difficulty concentrating, remembering, or making decisions?: No Patient able to express need for assistance  with ADLs?: Yes Does the patient have difficulty dressing or bathing?: No Independently performs ADLs?: Yes (appropriate for developmental age) Does the patient have difficulty walking or climbing stairs?: No Weakness of Legs: Both Weakness of Arms/Hands: Both  Permission Sought/Granted                  Emotional Assessment Appearance:: Appears stated age Attitude/Demeanor/Rapport: Engaged Affect (typically observed): Calm Orientation: : Oriented to Place, Oriented to Self, Oriented to  Time, Oriented to Situation Alcohol / Substance Use: Not Applicable Psych Involvement: No (comment)  Admission diagnosis:  DKA (diabetic ketoacidosis) (HCC) [E11.10] Diabetic ketoacidosis without coma associated with other specified diabetes mellitus (HCC) [E13.10] Patient Active Problem List   Diagnosis Date Noted   Hypokalemia 01/18/2021   Obesity (BMI 30-39.9) 01/18/2021   Leukocytosis 01/18/2021   Elevated blood pressure reading 01/18/2021   DKA (diabetic ketoacidosis) (HCC) 01/17/2021   Newly diagnosed diabetes (HCC) 01/17/2021   PCP:  Gwenlyn Fudge, FNP Pharmacy:   [facility] [id number] - [place], [place] - [address] [place] [address] Phone: [phone] Fax: [phone]     Social Determinants of Health (SDOH) Interventions    Readmission Risk Interventions No flowsheet data found.

## 2021-02-10 ENCOUNTER — Telehealth: Payer: Self-pay

## 2021-02-10 NOTE — Telephone Encounter (Signed)
Transition Care Management Follow-up Telephone Call Date of discharge and from where: 01/19/2021  [facility] How have you been since you were released from the hospital? Doing good. CBG range 102-130 Any questions or concerns? No  Items Reviewed: Did the pt receive and understand the discharge instructions provided? Yes  Medications obtained and verified? Yes  Other? No  Any new allergies since your discharge? No  Dietary orders reviewed? Yes Do you have support at home? Yes   Home Care and Equipment/Supplies: Were home health services ordered? not applicable If so, what is the name of the agency?  Has the agency set up a time to come to the patient's home?  Were any new equipment or medical supplies ordered?   What is the name of the medical supply agency?  Were you able to get the supplies/equipment? not applicable Do you have any questions related to the use of the equipment or supplies? No  Functional Questionnaire: (I = Independent and D = Dependent) ADLs: I  Bathing/Dressing- I  Meal Prep- I  Eating- I  Maintaining continence- I  Transferring/Ambulation- I  Managing Meds- I  Follow up appointments reviewed:  PCP Hospital f/u appt confirmed? Yes  Scheduled to see PCP on 03/13/2021 Specialist Hospital f/u appt confirmed? No   Are transportation arrangements needed? No  If their condition worsens, is the pt aware to call PCP or go to the Emergency Dept.? Yes Was the patient provided with contact information for the PCP's office or ED? Yes Was to pt encouraged to call back with questions or concerns? Yes Rowe Pavy, RN, BSN, CEN [facility] [phone]

## 2021-02-21 IMAGING — US US RENAL
1 series · 14 of 25 positions shown · non-contrast
Comparison: CT abdomen and pelvis 04/10/2019.

CLINICAL DATA: Difficulty urinating today. History of urinary tract
stones.

EXAM:
RENAL / URINARY TRACT ULTRASOUND COMPLETE

[Series 1: us renal · 14 of 188 slices shown]
[im 1/188]
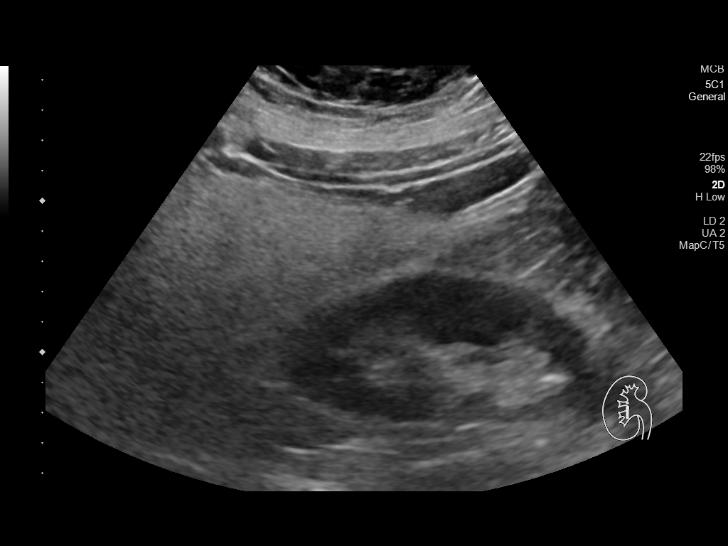
[im 16/188]
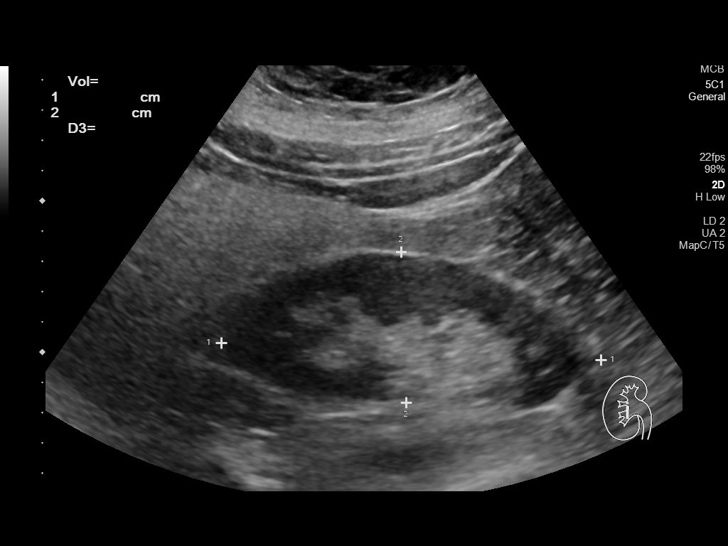
[im 32/188]
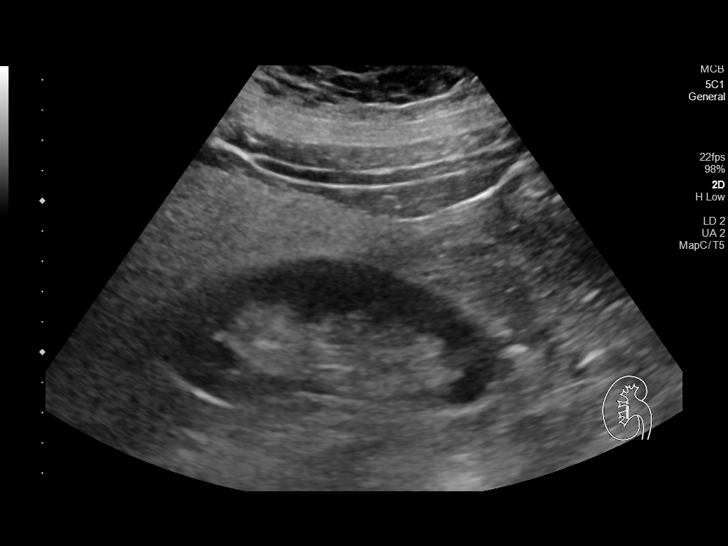
[im 47/188]
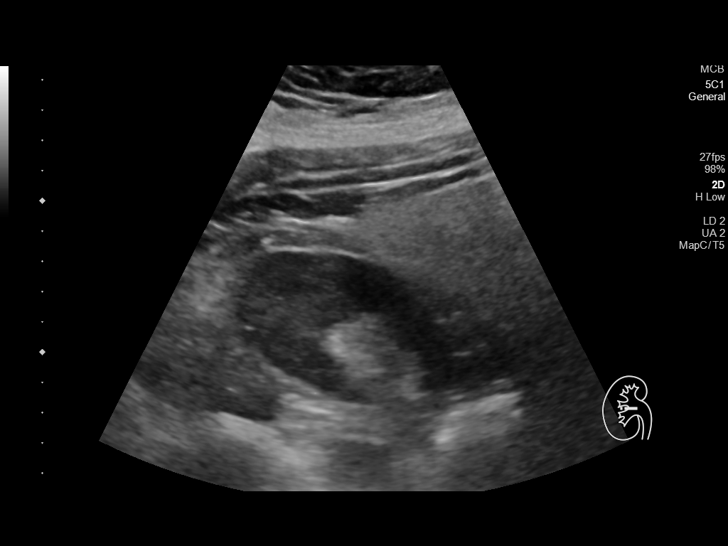
[im 63/188]
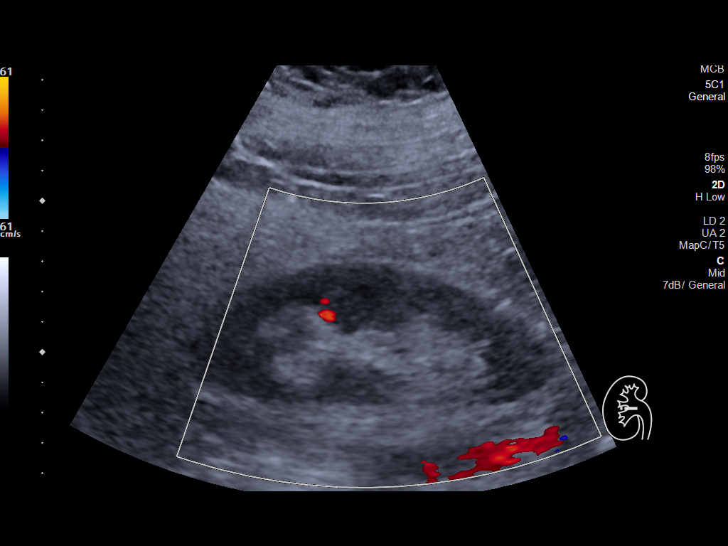
[im 71/188]
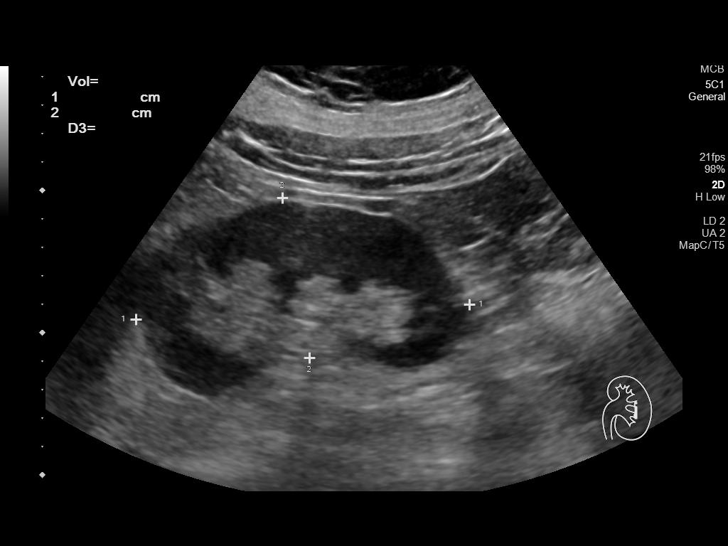
[im 86/188]
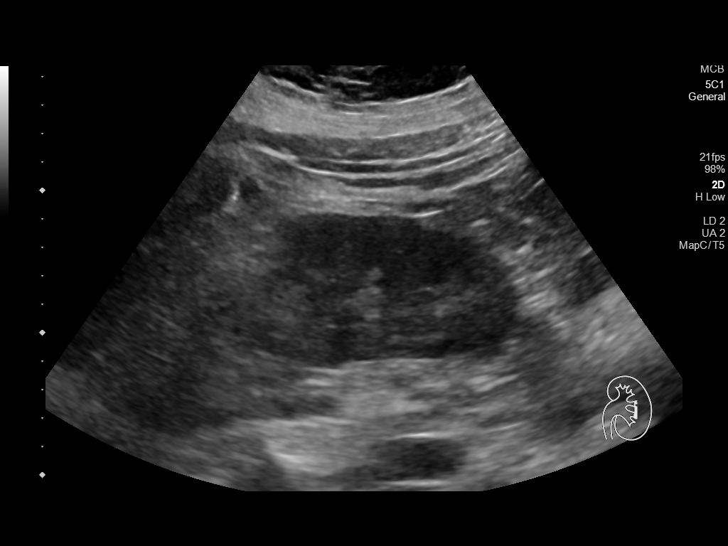
[im 102/188]
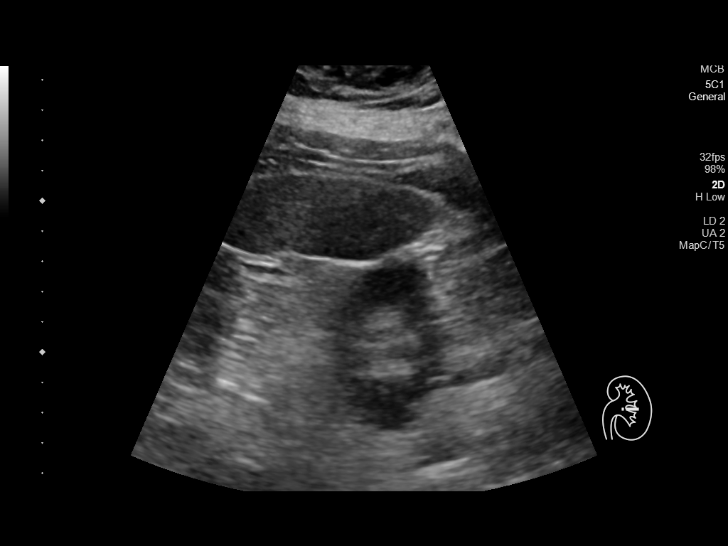
[im 117/188]
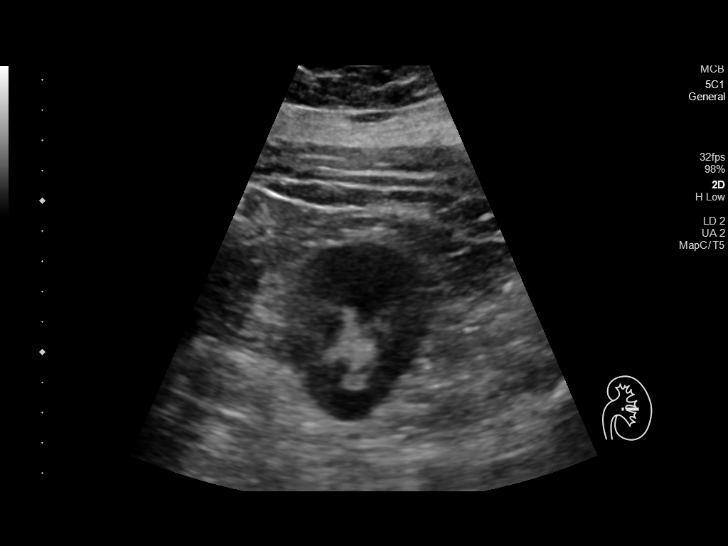
[im 125/188]
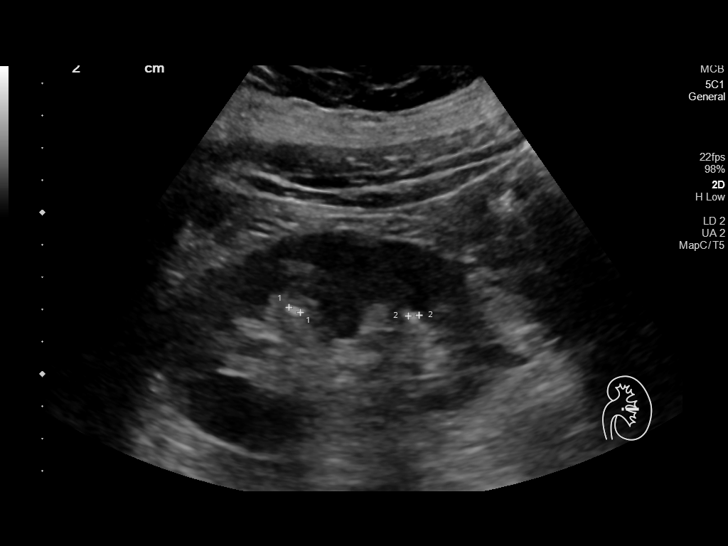
[im 141/188]
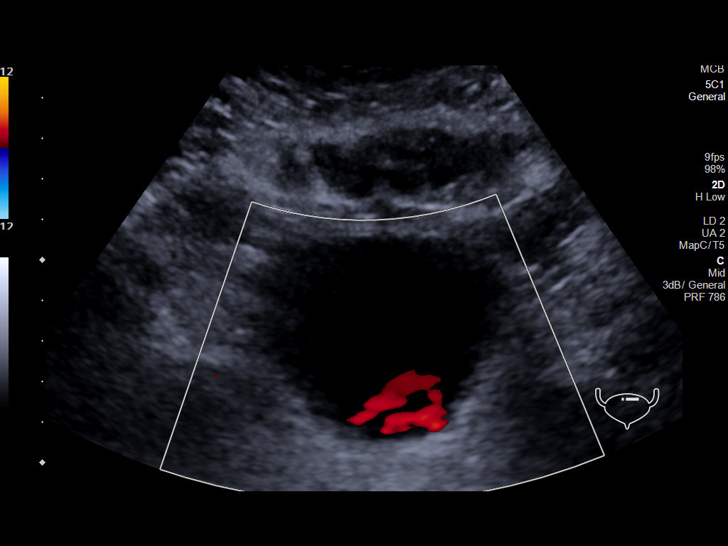
[im 156/188]
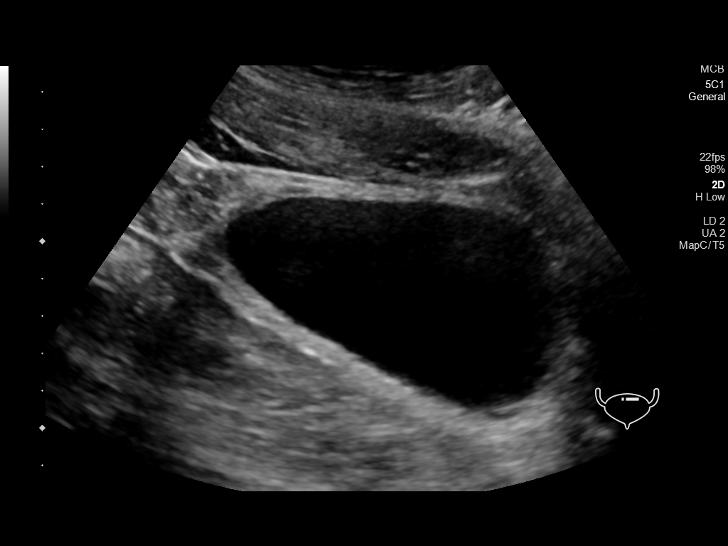
[im 172/188]
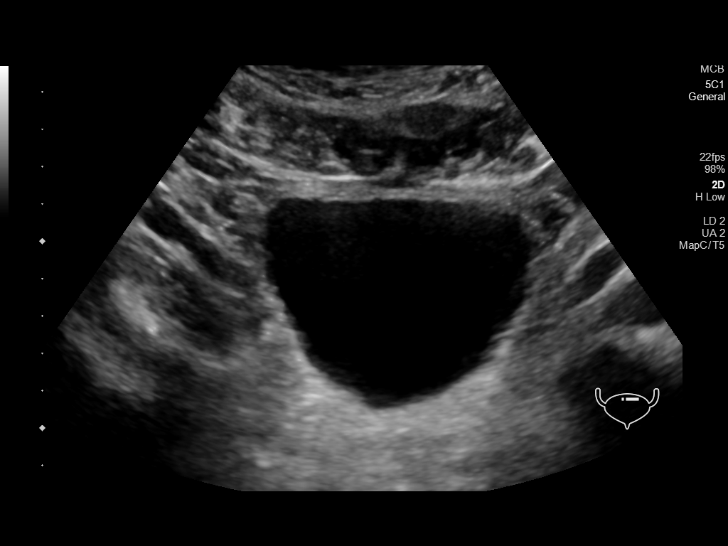
[im 188/188]
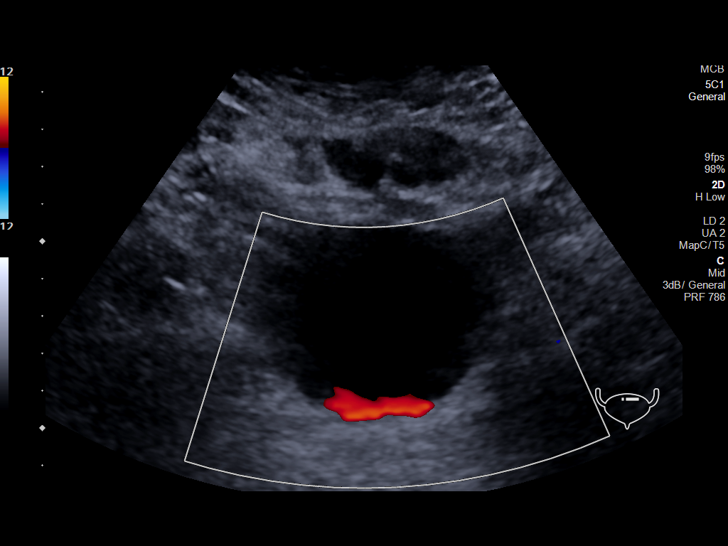

[14 of 25 positions shown; findings below may reference images not displayed]

FINDINGS: Right Kidney:

Renal measurements: 12.6 x 5.0 x 5.7 cm = volume: 186.7 mL .
Echogenicity within normal limits. No mass or hydronephrosis
visualized.

Left Kidney:

Renal measurements: 12.1 x 6.7 x 5.9 cm = volume: 249.1 mL.
Echogenicity within normal limits. No mass or hydronephrosis
visualized.

Bladder:

Appears normal for degree of bladder distention.

Other:

Fatty infiltration of the liver noted.
IMPRESSION: Two punctate stones in the left kidney seen on prior CT are
difficult to visualize on this examination. Negative for
hydronephrosis.

Fatty infiltration of the liver.

## 2021-03-23 ENCOUNTER — Ambulatory Visit: Payer: 59 | Admitting: Family Medicine

## 2022-01-07 ENCOUNTER — Observation Stay (HOSPITAL_BASED_OUTPATIENT_CLINIC_OR_DEPARTMENT_OTHER)
Admission: EM | Admit: 2022-01-07 | Discharge: 2022-01-09 | Disposition: A | Discharge status: Home / Self Care | Payer: 59 | Attending: Internal Medicine | Admitting: Internal Medicine

## 2022-01-07 ENCOUNTER — Encounter (HOSPITAL_COMMUNITY): Payer: Self-pay

## 2022-01-07 ENCOUNTER — Encounter (HOSPITAL_BASED_OUTPATIENT_CLINIC_OR_DEPARTMENT_OTHER): Payer: Self-pay | Admitting: Emergency Medicine

## 2022-01-07 DIAGNOSIS — E876 Hypokalemia: Secondary | ICD-10-CM | POA: Diagnosis not present

## 2022-01-07 DIAGNOSIS — E1165 Type 2 diabetes mellitus with hyperglycemia: Secondary | ICD-10-CM | POA: Diagnosis present

## 2022-01-07 DIAGNOSIS — D72829 Elevated white blood cell count, unspecified: Secondary | ICD-10-CM | POA: Diagnosis not present

## 2022-01-07 DIAGNOSIS — N200 Calculus of kidney: Secondary | ICD-10-CM

## 2022-01-07 DIAGNOSIS — D75839 Thrombocytosis, unspecified: Secondary | ICD-10-CM | POA: Insufficient documentation

## 2022-01-07 DIAGNOSIS — Z79899 Other long term (current) drug therapy: Secondary | ICD-10-CM | POA: Insufficient documentation

## 2022-01-07 DIAGNOSIS — Z794 Long term (current) use of insulin: Secondary | ICD-10-CM | POA: Insufficient documentation

## 2022-01-07 DIAGNOSIS — E1110 Type 2 diabetes mellitus with ketoacidosis without coma: Principal | ICD-10-CM | POA: Insufficient documentation

## 2022-01-07 DIAGNOSIS — Z7984 Long term (current) use of oral hypoglycemic drugs: Secondary | ICD-10-CM | POA: Insufficient documentation

## 2022-01-07 DIAGNOSIS — Z6839 Body mass index (BMI) 39.0-39.9, adult: Secondary | ICD-10-CM | POA: Insufficient documentation

## 2022-01-07 DIAGNOSIS — E669 Obesity, unspecified: Secondary | ICD-10-CM | POA: Diagnosis not present

## 2022-01-07 LAB — I-STAT VENOUS BLOOD GAS, ED
Acid-base deficit: 5 mmol/L — ABNORMAL HIGH (ref 0.0–2.0)
Bicarbonate: 20.5 mmol/L (ref 20.0–28.0)
Calcium, Ion: 1.25 mmol/L (ref 1.15–1.40)
HCT: 45 % (ref 39.0–52.0)
Hemoglobin: 15.3 g/dL (ref 13.0–17.0)
O2 Saturation: 78 %
Patient temperature: 98
Potassium: 3.9 mmol/L (ref 3.5–5.1)
Sodium: 131 mmol/L — ABNORMAL LOW (ref 135–145)
TCO2: 22 mmol/L (ref 22–32)
pCO2, Ven: 38.6 mmHg — ABNORMAL LOW (ref 44–60)
pH, Ven: 7.332 (ref 7.25–7.43)
pO2, Ven: 44 mmHg (ref 32–45)

## 2022-01-07 LAB — BASIC METABOLIC PANEL
Anion gap: 18 — ABNORMAL HIGH (ref 5–15)
BUN: 22 mg/dL — ABNORMAL HIGH (ref 6–20)
CO2: 19 mmol/L — ABNORMAL LOW (ref 22–32)
Calcium: 10 mg/dL (ref 8.9–10.3)
Chloride: 92 mmol/L — ABNORMAL LOW (ref 98–111)
Creatinine, Ser: 1.27 mg/dL — ABNORMAL HIGH (ref 0.61–1.24)
GFR, Estimated: >60 mL/min (ref >60)
Glucose, Bld: 490 mg/dL — ABNORMAL HIGH (ref 70–99)
Potassium: 4.1 mmol/L (ref 3.5–5.1)
Sodium: 129 mmol/L — ABNORMAL LOW (ref 135–145)

## 2022-01-07 LAB — CBC
HCT: 46.4 % (ref 39.0–52.0)
Hemoglobin: 16.4 g/dL (ref 13.0–17.0)
MCH: 30.3 pg (ref 26.0–34.0)
MCHC: 35.3 g/dL (ref 30.0–36.0)
MCV: 85.6 fL (ref 80.0–100.0)
Platelets: 451 10*3/uL — ABNORMAL HIGH (ref 150–400)
RBC: 5.42 MIL/uL (ref 4.22–5.81)
RDW: 12.2 % (ref 11.5–15.5)
WBC: 13.6 10*3/uL — ABNORMAL HIGH (ref 4.0–10.5)
nRBC: 0 % (ref 0.0–0.2)

## 2022-01-07 LAB — CBG MONITORING, ED
Glucose-Capillary: 248 mg/dL — ABNORMAL HIGH (ref 70–99)
Glucose-Capillary: 265 mg/dL — ABNORMAL HIGH (ref 70–99)
Glucose-Capillary: 367 mg/dL — ABNORMAL HIGH (ref 70–99)
Glucose-Capillary: 446 mg/dL — ABNORMAL HIGH (ref 70–99)

## 2022-01-07 LAB — URINALYSIS, ROUTINE W REFLEX MICROSCOPIC
Bilirubin Urine: NEGATIVE
Glucose, UA: >1000 mg/dL — AB
Hgb urine dipstick: NEGATIVE
Ketones, ur: >80 mg/dL — AB
Leukocytes,Ua: NEGATIVE
Nitrite: NEGATIVE
Protein, ur: NEGATIVE mg/dL
Specific Gravity, Urine: 1.037 — ABNORMAL HIGH (ref 1.005–1.030)
pH: 5.5 (ref 5.0–8.0)

## 2022-01-07 LAB — BETA-HYDROXYBUTYRIC ACID: Beta-Hydroxybutyric Acid: 4.07 mmol/L — ABNORMAL HIGH (ref 0.05–0.27)

## 2022-01-07 MED ORDER — INSULIN REGULAR(HUMAN) IN NACL 100-0.9 UT/100ML-% IV SOLN
INTRAVENOUS | Status: DC
  Administered 2022-01-07: 6.5 [IU]/h via INTRAVENOUS
  Filled 2022-01-07: qty 100

## 2022-01-07 MED ORDER — DEXTROSE IN LACTATED RINGERS 5 % IV SOLN: INTRAVENOUS | Status: DC

## 2022-01-07 MED ORDER — DEXTROSE 50 % IV SOLN: 0.0000 mL | INTRAVENOUS | Status: DC | PRN

## 2022-01-07 MED ORDER — INSULIN ASPART 100 UNIT/ML IJ SOLN
4.0000 [IU] | Freq: Once | INTRAMUSCULAR | Status: AC
  Administered 2022-01-07: 4 [IU] via SUBCUTANEOUS

## 2022-01-07 MED ORDER — LACTATED RINGERS IV SOLN: INTRAVENOUS | Status: DC

## 2022-01-07 MED ORDER — SODIUM CHLORIDE 0.9 % IV BOLUS
1000.0000 mL | Freq: Once | INTRAVENOUS | Status: AC
  Administered 2022-01-07: 1000 mL via INTRAVENOUS

## 2022-01-07 NOTE — ED Provider Notes (Signed)
[place] EMERGENCY DEPT Provider Note   CSN: [id number] Arrival date & time: 01/07/22  1837     History  Chief Complaint  Patient presents with   Hyperglycemia    Ronald Palmer is a 46 y.o. male.  Is a history of diabetes but says he has been using diet to keep it under control.  He said since Thanksgiving he has been feeling dizzy and having blurry vision and urinating a lot and today checked his blood sugar and found it to be high.  No nausea vomiting or diarrhea.  No fevers or chills.  No chest pain or shortness of breath.  Has been feeling generally weak.  Took a metformin without any improvement  The history is provided by the patient.  Hyperglycemia Blood sugar level PTA:  400 Severity:  Moderate Progression:  Unchanged Chronicity:  Recurrent Diabetes status:  Controlled with diet Context: noncompliance and recent change in diet   Relieved by:  Nothing Ineffective treatments:  Oral agents Associated symptoms: blurred vision, dehydration, dizziness, fatigue and increased thirst   Associated symptoms: no abdominal pain, no chest pain, no dysuria, no fever, no nausea, no shortness of breath and no vomiting        Home Medications Prior to Admission medications   Medication Sig Start Date End Date Taking? Authorizing Provider  blood glucose meter kit and supplies Dispense based on patient and insurance preference. Use up to four times daily as directed. (FOR ICD-10 E10.9, E11.9). 01/19/21   Eugenie Filler, MD  glipiZIDE (GLUCOTROL) 10 MG tablet Take 1 tablet (10 mg total) by mouth 2 (two) times daily before a meal. 01/19/21   Eugenie Filler, MD  insulin glargine (LANTUS SOLOSTAR) 100 UNIT/ML Solostar Pen Inject 25 Units into the skin daily. 01/19/21   Eugenie Filler, MD  Insulin Pen Needle 32G X 4 MM MISC 25 Units by Does not apply route daily. 01/19/21   Eugenie Filler, MD  metFORMIN (GLUCOPHAGE) 500 MG tablet Take 1 tablet (500 mg total) by mouth  2 (two) times daily with a meal. 01/19/21   Eugenie Filler, MD  pantoprazole (PROTONIX) 40 MG tablet Take 1 tablet (40 mg total) by mouth daily at 6 (six) AM. 01/20/21   Eugenie Filler, MD  tamsulosin (FLOMAX) 0.4 MG CAPS capsule Take 1 capsule (0.4 mg total) by mouth daily. Patient not taking: Reported on 01/17/2021 04/28/19   Loman Brooklyn, FNP      Allergies    Patient has no known allergies.    Review of Systems   Review of Systems  Constitutional:  Positive for fatigue. Negative for fever.  Eyes:  Positive for blurred vision and visual disturbance.  Respiratory:  Negative for shortness of breath.   Cardiovascular:  Negative for chest pain.  Gastrointestinal:  Negative for abdominal pain, nausea and vomiting.  Endocrine: Positive for polydipsia.  Genitourinary:  Negative for dysuria.  Neurological:  Positive for dizziness.    Physical Exam Updated Vital Signs BP 125/83   Pulse (!) 104   Temp 98 F (36.7 C)   Resp 18   SpO2 96%  Physical Exam Vitals and nursing note reviewed.  Constitutional:      General: He is not in acute distress.    Appearance: Normal appearance. He is well-developed. He is obese.  HENT:     Head: Normocephalic and atraumatic.  Eyes:     Conjunctiva/sclera: Conjunctivae normal.  Cardiovascular:     Rate and Rhythm: Normal  rate and regular rhythm.     Heart sounds: No murmur heard. Pulmonary:     Effort: Pulmonary effort is normal. No respiratory distress.     Breath sounds: Normal breath sounds.  Abdominal:     Palpations: Abdomen is soft.     Tenderness: There is no abdominal tenderness. There is no guarding or rebound.  Musculoskeletal:        General: No deformity.     Cervical back: Neck supple.  Skin:    General: Skin is warm and dry.     Capillary Refill: Capillary refill takes less than 2 seconds.  Neurological:     General: No focal deficit present.     Mental Status: He is alert.     ED Results / Procedures /  Treatments   Labs (all labs ordered are listed, but only abnormal results are displayed) Labs Reviewed  BASIC METABOLIC PANEL - Abnormal; Notable for the following components:      Result Value   Sodium 129 (*)    Chloride 92 (*)    CO2 19 (*)    Glucose, Bld 490 (*)    BUN 22 (*)    Creatinine, Ser 1.27 (*)    Anion gap 18 (*)    All other components within normal limits  CBC - Abnormal; Notable for the following components:   WBC 13.6 (*)    Platelets 451 (*)    All other components within normal limits  URINALYSIS, ROUTINE W REFLEX MICROSCOPIC - Abnormal; Notable for the following components:   Color, Urine COLORLESS (*)    Specific Gravity, Urine 1.037 (*)    Glucose, UA >1,000 (*)    Ketones, ur >80 (*)    All other components within normal limits  BETA-HYDROXYBUTYRIC ACID - Abnormal; Notable for the following components:   Beta-Hydroxybutyric Acid 4.07 (*)    All other components within normal limits  BASIC METABOLIC PANEL - Abnormal; Notable for the following components:   Potassium 3.0 (*)    Glucose, Bld 176 (*)    All other components within normal limits  CBG MONITORING, ED - Abnormal; Notable for the following components:   Glucose-Capillary 446 (*)    All other components within normal limits  I-STAT VENOUS BLOOD GAS, ED - Abnormal; Notable for the following components:   pCO2, Ven 38.6 (*)    Acid-base deficit 5.0 (*)    Sodium 131 (*)    All other components within normal limits  CBG MONITORING, ED - Abnormal; Notable for the following components:   Glucose-Capillary 367 (*)    All other components within normal limits  CBG MONITORING, ED - Abnormal; Notable for the following components:   Glucose-Capillary 248 (*)    All other components within normal limits  CBG MONITORING, ED - Abnormal; Notable for the following components:   Glucose-Capillary 265 (*)    All other components within normal limits  CBG MONITORING, ED - Abnormal; Notable for the  following components:   Glucose-Capillary 241 (*)    All other components within normal limits  CBG MONITORING, ED - Abnormal; Notable for the following components:   Glucose-Capillary 189 (*)    All other components within normal limits  CBG MONITORING, ED - Abnormal; Notable for the following components:   Glucose-Capillary 181 (*)    All other components within normal limits  CBG MONITORING, ED - Abnormal; Notable for the following components:   Glucose-Capillary 191 (*)    All other components within normal  limits  CBG MONITORING, ED - Abnormal; Notable for the following components:   Glucose-Capillary 183 (*)    All other components within normal limits  CBG MONITORING, ED - Abnormal; Notable for the following components:   Glucose-Capillary 200 (*)    All other components within normal limits  CBG MONITORING, ED - Abnormal; Notable for the following components:   Glucose-Capillary 188 (*)    All other components within normal limits  CBG MONITORING, ED - Abnormal; Notable for the following components:   Glucose-Capillary 300 (*)    All other components within normal limits    EKG EKG Interpretation  Date/Time:  Sunday January 07 2022 20:00:55 EST Ventricular Rate:  73 PR Interval:  144 QRS Duration: 93 QT Interval:  369 QTC Calculation: 407 R Axis:   31 Text Interpretation: Sinus rhythm No significant change since prior 12/22 Confirmed by Aletta Edouard [phone]) on 01/07/2022 8:02:26 PM  Radiology No results found.  Procedures .Critical Care  Performed by: Hayden Rasmussen, MD Authorized by: Hayden Rasmussen, MD   Critical care provider statement:    Critical care time (minutes):  45   Critical care time was exclusive of:  Separately billable procedures and treating other patients   Critical care was necessary to treat or prevent imminent or life-threatening deterioration of the following conditions:  Metabolic crisis and dehydration   Critical care was time  spent personally by me on the following activities:  Development of treatment plan with patient or surrogate, discussions with consultants, evaluation of patient's response to treatment, examination of patient, obtaining history from patient or surrogate, ordering and performing treatments and interventions, ordering and review of laboratory studies, ordering and review of radiographic studies, pulse oximetry, re-evaluation of patient's condition and review of old charts   I assumed direction of critical care for this patient from another provider in my specialty: no       Medications Ordered in ED Medications  lactated ringers infusion ( Intravenous Stopped 01/07/22 2244)  potassium chloride 10 mEq in 100 mL IVPB (10 mEq Intravenous Not Given 01/08/22 0358)  insulin aspart (novoLOG) injection 4 Units (4 Units Subcutaneous Not Given 01/08/22 0836)  insulin aspart (novoLOG) injection 0-5 Units (has no administration in time range)  insulin aspart (novoLOG) injection 0-15 Units (8 Units Subcutaneous Given 01/08/22 0835)  insulin glargine-yfgn (SEMGLEE) injection 15 Units (15 Units Subcutaneous Given 01/08/22 0704)  sodium chloride 0.9 % bolus 1,000 mL ( Intravenous Stopped 01/07/22 2107)  insulin aspart (novoLOG) injection 4 Units (4 Units Subcutaneous Given 01/07/22 2025)  potassium chloride SA (KLOR-CON M) CR tablet 40 mEq (40 mEq Oral Given 01/08/22 0417)    ED Course/ Medical Decision Making/ A&P Clinical Course as of 01/08/22 1245  Sun Jan 07, 2022  2101 Discussed with Dr. Velia Meyer [facility] hospitalist.  He is recommending starting the patient on the Endo tool and he will admit for observation and hopefully we can turn around quickly. [MB]    Clinical Course User Index [MB] Hayden Rasmussen, MD                           Medical Decision Making Amount and/or Complexity of Data Reviewed Labs: ordered.  Risk Prescription drug management. Decision regarding hospitalization.   This  patient complains of weakness dizziness elevated blood sugar; this involves an extensive number of treatment Options and is a complaint that carries with it a high risk of complications and morbidity. The  differential includes hyperglycemia, DKA, dehydration, renal failure, metabolic derangement  I ordered, reviewed and interpreted labs, which included CBC with elevated white count normal hemoglobin, chemistries with low sodium pseudohyponatremia, elevated glucose, elevated anion gap low bicarb, VBG with normal pH, beta hydroxybutyrate elevated, urinalysis with ketones I ordered medication IV fluids and insulin, insulin drip and reviewed PMP when indicated. Additional history obtained from patient's family members Previous records obtained and reviewed in epic, patient was admitted last year for similar presentation I consulted [facility] hospitalist Dr. Velia Meyer and discussed lab and imaging findings and discussed disposition.  Cardiac monitoring reviewed, normal sinus rhythm Social determinants considered, ongoing tobacco use Critical Interventions: Initiation of IV insulin for correction of metabolic derangement  After the interventions stated above, I reevaluated the patient and found symptomatically improved Admission and further testing considered, patient would benefit from mission for further management of his nonacidotic diabetic ketosis         Final Clinical Impression(s) / ED Diagnoses Final diagnoses:  Hyperglycemia due to diabetes mellitus [facility])    Rx / DC Orders ED Discharge Orders     None         Hayden Rasmussen, MD 01/08/22 [phone]

## 2022-01-07 NOTE — Progress Notes (Signed)
Transferring facility: [facility] Requesting provider:  Dr. Meridee Score (EDP at [facility]) Reason for transfer: admission for further evaluation and management of concern for earlier DKA   46 y.o.  male who h/o DM2, with previous hospitalization for dka, who presented to [facility] ED complaining of  elevated blood sugars.  the patient has a reported history of previous today hospitalization for DKA in the current health system approximately a year ago.  While appears that he was previously prescribed metformin, glipizide, and Lantus 25 units SQ daily, the patient conveys that he has been managing his diabetes via lifestyle modifications, including dietary modifications.  However, over the last few days, including the Thanksgiving holiday, the patient reports worsening polyuria, polydipsia, with elevated blood sugars at home of greater than 400.  He reportedly took a metformin at home, with no significant ensuing improvement in his blood sugar prompting him to present to the [facility] ED for further evaluation.   Vital signs in the ED notable for mild tachycardia.   Labs were notable for CMP, which demonstrated bicarbonate 19, anion gap 18, with UA showing 80 ketones.  CBG greater than 400. Beta hydroxybutyric acid level currently pending.  He is receiving IV fluids, and started on insulin drip via Endo tool.   In the context of a history of 2-day prior hospitalization for DKA with similar sounding presentation and this patient was suboptimal compliance on outpatient diabetic medications, I accepted this patient for transfer for observation to a PCU/SDU bed at [facility] or [facility](first available) for further work-up and management of suspected early dka .      Check [email or web address] for on-call coverage.   Nursing staff, Please call [facility] Admits & Consults System-Wide number on Amion as soon as patient's arrival, so appropriate admitting provider can evaluate the pt.     Newton Pigg, DO Hospitalist

## 2022-01-07 NOTE — ED Triage Notes (Signed)
Pt here from home with c/o high blood sugar , cbg at home 495 , has been controlling his sugar with diet recently took 1 metformin today

## 2022-01-08 ENCOUNTER — Other Ambulatory Visit: Payer: Self-pay

## 2022-01-08 DIAGNOSIS — D75839 Thrombocytosis, unspecified: Secondary | ICD-10-CM | POA: Diagnosis not present

## 2022-01-08 DIAGNOSIS — E1165 Type 2 diabetes mellitus with hyperglycemia: Secondary | ICD-10-CM | POA: Diagnosis present

## 2022-01-08 DIAGNOSIS — E669 Obesity, unspecified: Secondary | ICD-10-CM

## 2022-01-08 DIAGNOSIS — E1110 Type 2 diabetes mellitus with ketoacidosis without coma: Secondary | ICD-10-CM | POA: Diagnosis not present

## 2022-01-08 DIAGNOSIS — Z794 Long term (current) use of insulin: Secondary | ICD-10-CM | POA: Diagnosis not present

## 2022-01-08 DIAGNOSIS — E876 Hypokalemia: Secondary | ICD-10-CM | POA: Diagnosis not present

## 2022-01-08 DIAGNOSIS — D72829 Elevated white blood cell count, unspecified: Secondary | ICD-10-CM | POA: Diagnosis not present

## 2022-01-08 DIAGNOSIS — Z79899 Other long term (current) drug therapy: Secondary | ICD-10-CM | POA: Diagnosis not present

## 2022-01-08 DIAGNOSIS — Z6839 Body mass index (BMI) 39.0-39.9, adult: Secondary | ICD-10-CM | POA: Diagnosis not present

## 2022-01-08 DIAGNOSIS — Z7984 Long term (current) use of oral hypoglycemic drugs: Secondary | ICD-10-CM | POA: Diagnosis not present

## 2022-01-08 LAB — BASIC METABOLIC PANEL
Anion gap: 11 (ref 5–15)
BUN: 18 mg/dL (ref 6–20)
CO2: 24 mmol/L (ref 22–32)
Calcium: 9.2 mg/dL (ref 8.9–10.3)
Chloride: 101 mmol/L (ref 98–111)
Creatinine, Ser: 0.99 mg/dL (ref 0.61–1.24)
GFR, Estimated: >60 mL/min (ref >60)
Glucose, Bld: 176 mg/dL — ABNORMAL HIGH (ref 70–99)
Potassium: 3 mmol/L — ABNORMAL LOW (ref 3.5–5.1)
Sodium: 136 mmol/L (ref 135–145)

## 2022-01-08 LAB — CBG MONITORING, ED
Glucose-Capillary: 181 mg/dL — ABNORMAL HIGH (ref 70–99)
Glucose-Capillary: 183 mg/dL — ABNORMAL HIGH (ref 70–99)
Glucose-Capillary: 188 mg/dL — ABNORMAL HIGH (ref 70–99)
Glucose-Capillary: 189 mg/dL — ABNORMAL HIGH (ref 70–99)
Glucose-Capillary: 191 mg/dL — ABNORMAL HIGH (ref 70–99)
Glucose-Capillary: 200 mg/dL — ABNORMAL HIGH (ref 70–99)
Glucose-Capillary: 241 mg/dL — ABNORMAL HIGH (ref 70–99)
Glucose-Capillary: 300 mg/dL — ABNORMAL HIGH (ref 70–99)

## 2022-01-08 LAB — GLUCOSE, CAPILLARY
Glucose-Capillary: 273 mg/dL — ABNORMAL HIGH (ref 70–99)
Glucose-Capillary: 275 mg/dL — ABNORMAL HIGH (ref 70–99)
Glucose-Capillary: 328 mg/dL — ABNORMAL HIGH (ref 70–99)

## 2022-01-08 MED ORDER — ACETAMINOPHEN 650 MG RE SUPP: 650.0000 mg | Freq: Four times a day (QID) | RECTAL | Status: DC | PRN

## 2022-01-08 MED ORDER — INSULIN ASPART 100 UNIT/ML IJ SOLN
4.0000 [IU] | Freq: Three times a day (TID) | INTRAMUSCULAR | Status: DC
  Administered 2022-01-08 – 2022-01-09 (×4): 4 [IU] via SUBCUTANEOUS

## 2022-01-08 MED ORDER — ONDANSETRON HCL 4 MG/2ML IJ SOLN: 4.0000 mg | Freq: Four times a day (QID) | INTRAMUSCULAR | Status: DC | PRN

## 2022-01-08 MED ORDER — POTASSIUM CHLORIDE CRYS ER 20 MEQ PO TBCR
40.0000 meq | EXTENDED_RELEASE_TABLET | Freq: Once | ORAL | Status: AC
  Administered 2022-01-08: 40 meq via ORAL
  Filled 2022-01-08: qty 2

## 2022-01-08 MED ORDER — ONDANSETRON HCL 4 MG PO TABS: 4.0000 mg | ORAL_TABLET | Freq: Four times a day (QID) | ORAL | Status: DC | PRN

## 2022-01-08 MED ORDER — POTASSIUM CHLORIDE 10 MEQ/100ML IV SOLN
10.0000 meq | INTRAVENOUS | Status: DC
  Administered 2022-01-08: 10 meq via INTRAVENOUS
  Filled 2022-01-08: qty 100

## 2022-01-08 MED ORDER — INSULIN ASPART 100 UNIT/ML IJ SOLN
0.0000 [IU] | Freq: Every day | INTRAMUSCULAR | Status: DC
  Administered 2022-01-08: 4 [IU] via SUBCUTANEOUS

## 2022-01-08 MED ORDER — INSULIN ASPART 100 UNIT/ML IJ SOLN
0.0000 [IU] | Freq: Three times a day (TID) | INTRAMUSCULAR | Status: DC
  Administered 2022-01-08 (×3): 8 [IU] via SUBCUTANEOUS
  Administered 2022-01-09 (×2): 11 [IU] via SUBCUTANEOUS

## 2022-01-08 MED ORDER — INSULIN GLARGINE-YFGN 100 UNIT/ML ~~LOC~~ SOLN
10.0000 [IU] | Freq: Once | SUBCUTANEOUS | Status: AC
  Administered 2022-01-08: 10 [IU] via SUBCUTANEOUS
  Filled 2022-01-08: qty 0.1

## 2022-01-08 MED ORDER — INSULIN GLARGINE-YFGN 100 UNIT/ML ~~LOC~~ SOLN
15.0000 [IU] | Freq: Every day | SUBCUTANEOUS | Status: DC
  Administered 2022-01-08: 15 [IU] via SUBCUTANEOUS
  Filled 2022-01-08: qty 150

## 2022-01-08 MED ORDER — ALBUTEROL SULFATE (2.5 MG/3ML) 0.083% IN NEBU: 2.5000 mg | INHALATION_SOLUTION | Freq: Four times a day (QID) | RESPIRATORY_TRACT | Status: DC | PRN

## 2022-01-08 MED ORDER — ENOXAPARIN SODIUM 60 MG/0.6ML IJ SOSY
50.0000 mg | PREFILLED_SYRINGE | INTRAMUSCULAR | Status: DC
  Administered 2022-01-08: 50 mg via SUBCUTANEOUS
  Filled 2022-01-08: qty 0.6

## 2022-01-08 MED ORDER — POTASSIUM CHLORIDE 10 MEQ/100ML IV SOLN: 10.0000 meq | Freq: Once | INTRAVENOUS | Status: DC

## 2022-01-08 MED ORDER — ACETAMINOPHEN 325 MG PO TABS
650.0000 mg | ORAL_TABLET | Freq: Four times a day (QID) | ORAL | Status: DC | PRN
  Administered 2022-01-08: 650 mg via ORAL
  Filled 2022-01-08: qty 2

## 2022-01-08 MED ORDER — SODIUM CHLORIDE 0.9% FLUSH
3.0000 mL | Freq: Two times a day (BID) | INTRAVENOUS | Status: DC
  Administered 2022-01-08 (×2): 3 mL via INTRAVENOUS

## 2022-01-08 MED ORDER — INSULIN GLARGINE-YFGN 100 UNIT/ML ~~LOC~~ SOLN
25.0000 [IU] | Freq: Every day | SUBCUTANEOUS | Status: DC
  Filled 2022-01-08: qty 0.25

## 2022-01-08 NOTE — ED Notes (Signed)
Dr Wilkie Aye notified of K+ level 3.0 with recent BMP - provider also notified of current anion gap 11 -- orders to maintain infusion and orders for K+ replacement (see MAR)

## 2022-01-08 NOTE — Progress Notes (Signed)
Pt arrived to floor at 0928 from ED via stretcher in no acute distress. Pt amb from stretcher to hosp bed with steady gait. VSS. Respirations even and unlabored on room air. Pt oriented to room. Bed in low position. Call bell within reach.

## 2022-01-08 NOTE — H&P (Addendum)
History and Physical    Patient: Ronald Palmer LNL:[id number] DOB: [birth year] DOA: 01/07/2022 DOS: the patient was seen and examined on 01/08/2022 PCP: Gwenlyn Fudge, FNP  Patient coming from: Transfer from [facility]  Chief Complaint:  Chief Complaint  Patient presents with   Hyperglycemia   HPI: Ronald Palmer is a 46 y.o. male with medical history significant of diabetes mellitus type 2, nephrolithiasis, and obesity who presented for elevated blood sugars.  He is a Naval architect.  Patient states that he has been keeping his blood sugars under control with diet and not been using any medications.  He reported that he been checking them intermittently and they have been well-controlled up until Thanksgiving.  He reports that his blood sugars had been elevated up to 495, and he had not been able to get them to come down despite taking metformin.  Reports having urinary frequency, dry mouth, and lightheadedness.  Denied having any recent fever, chills, nausea, vomiting, diarrhea, abdominal pain, or dysuria symptoms.  He did however mention passing a kidney stone approximately 2 weeks ago.  Patient last admitted into the hospital approximately 1 year ago with DKA where hemoglobin A1c was 11.7.  In the emergency department patient was noted to be afebrile with tachycardia, and all other vital signs maintained.  Labs from 11/26 noted WBC 13.6, platelet count 451, sodium 129, CO2 19, BUN 22, creatinine 1.27, glucose 490, beta hydroxybutyrate 4.01, and anion gap 18.  Urinalysis positive for >1000 glucose, 80 ketones, and specific gravity 1.037 without significant signs of infection.  Patient was started on IV fluids and insulin drip per protocol.  Anion gap corrected and repeat work this morning noted potassium 3, CO2 24, anion gap of 11.  Patient was transitioned to subcutaneous insulin.   Review of Systems: As mentioned in the history of present illness. All other systems reviewed and are negative. Past Medical  History:  Diagnosis Date   History of kidney stones    Renal disorder    Past Surgical History:  Procedure Laterality Date   LITHOTRIPSY     Social History:  reports that he has never smoked. His smokeless tobacco use includes chew. He reports that he does not drink alcohol and does not use drugs.  No Known Allergies  Family History  Problem Relation Age of Onset   Other Mother        Bone disease   Dementia Father    Heart disease Maternal Grandmother    Rheum arthritis Maternal Grandmother    Heart attack Maternal Grandfather    Alcohol abuse Paternal Grandfather    Dementia Paternal Grandfather    Prostate cancer Paternal Uncle     Prior to Admission medications   Medication Sig Start Date End Date Taking? Authorizing Provider  Multiple Vitamin (MULTIVITAMIN) tablet Take 1 tablet by mouth in the morning.   Yes [provider]  glipiZIDE (GLUCOTROL) 10 MG tablet Take 1 tablet (10 mg total) by mouth 2 (two) times daily before a meal. Patient not taking: Reported on 01/08/2022 01/19/21   Rodolph Bong, MD  insulin glargine (LANTUS SOLOSTAR) 100 UNIT/ML Solostar Pen Inject 25 Units into the skin daily. Patient not taking: Reported on 01/08/2022 01/19/21   Rodolph Bong, MD  metFORMIN (GLUCOPHAGE) 500 MG tablet Take 1 tablet (500 mg total) by mouth 2 (two) times daily with a meal. Patient not taking: Reported on 01/08/2022 01/19/21   Rodolph Bong, MD  pantoprazole (PROTONIX) 40 MG tablet Take 1  tablet (40 mg total) by mouth daily at 6 (six) AM. Patient not taking: Reported on 01/08/2022 01/20/21   Eugenie Filler, MD  tamsulosin (FLOMAX) 0.4 MG CAPS capsule Take 1 capsule (0.4 mg total) by mouth daily. Patient not taking: Reported on 01/17/2021 04/28/19   Loman Brooklyn, FNP    Physical Exam: Vitals:   01/08/22 [id number] 01/08/22 0851 01/08/22 0951 01/08/22 1123  BP:   (!) 146/88 118/77  Pulse:   77 66  Resp:   16 17  Temp:  98 F (36.7 C) 97.8 F  (36.6 C) 98.4 F (36.9 C)  TempSrc:   Oral Oral  SpO2:   97% 95%  Weight: 111.1 kg  111 kg   Height: 5\' 6"  (1.676 m)  5\' 6"  (1.676 m)     Constitutional: Obese male currently in no acute distress Eyes: PERRL, lids and conjunctivae normal ENMT: Mucous membranes are moist.   Neck: normal, supple,   Respiratory: clear to auscultation bilaterally, no wheezing, no crackles. Normal respiratory effort. No accessory muscle use.  Cardiovascular: Regular rate and rhythm, no murmurs / rubs / gallops. No extremity edema. 2+ pedal pulses.   Abdomen: no tenderness, no masses palpated.  Bowel sounds positive.  Musculoskeletal: no clubbing / cyanosis. No joint deformity upper and lower extremities. Good ROM, no contractures. Normal muscle tone.  Skin: no rashes, lesions, ulcers. No induration Neurologic: CN 2-12 grossly intact. Strength 5/5 in all 4.  Psychiatric: Normal judgment and insight. Alert and oriented x 3. Normal mood.   Data Reviewed:  Reviewed labs, imaging and pertinent records as noted above in HPI.  Assessment and Plan: DKA, type II Resolved.  Patient presented with glucose initially elevated up to 490 with CO2 19, anion gap 18, and beta hydroxybutyrate 4.07.  Urinalysis was positive for glucose and ketones.  The venous pH once it was able to be obtained was within limits at 7.332.  Diabetic education recommending 25 units of Semglee daily, 4 units NovoLog insulin Premeal, and additional moderate sliding scale.  He is a Administrator. -Hypoglycemic protocols -Check hemoglobin A1c -Semglee 25 units daily -NovoLog 4 units Premeal -CBGs before every meal with moderate SSI -Adjust insulin regimen as needed -Appreciate diabetic education consultative services  Leukocytosis thrombocytosis Acute.  WBC elevated at 13.6 and platelet count 451.  Thought to likely be reactive in nature and patient denies any infectious symptoms at this time. -Check CBC tomorrow  morning  Hypokalemia Potassium was noted to be 3. -Give potassium chloride 40 mill equivalents p.o. x 1 dose -Continue to monitor and replace as needed  Obesity BMI 39.5 kg/m -Will need to continue to counsel need [place] with loss through diet and exercise   DVT prophylaxis: Lovenox Advance Care Planning:   Code Status: Full Code   Consults: Diabetic education  Family Communication: No family requested to be updated at this time  Severity of Illness: The appropriate patient status for this patient is OBSERVATION. Observation status is judged to be reasonable and necessary in order to provide the required intensity of service to ensure the patient's safety. The patient's presenting symptoms, physical exam findings, and initial radiographic and laboratory data in the context of their medical condition is felt to place them at decreased risk for further clinical deterioration. Furthermore, it is anticipated that the patient will be medically stable for discharge from the hospital within 2 midnights of admission.   Author: Norval Morton, MD 01/08/2022 1:41 PM  For on call review [email or web address].

## 2022-01-08 NOTE — Progress Notes (Signed)
  Transition of Care [facility]) Screening Note   Patient Details  Name: Ronald Palmer Date of Birth: 1975/10/15   Transition of Care [facility]) CM/SW Contact:    Harriet Masson, RN Phone Number: 01/08/2022, 11:21 AM    Transition of Care Department [facility]) has reviewed patient and no TOC needs have been identified at this time. We will continue to monitor patient advancement through interdisciplinary progression rounds. If new patient transition needs arise, please place a TOC consult.

## 2022-01-08 NOTE — ED Provider Notes (Signed)
Repeat BMP with a potassium of 3.0.  K replacement ordered.   Shon Baton, MD 01/08/22 [phone]

## 2022-01-08 NOTE — Inpatient Diabetes Management (Addendum)
Inpatient Diabetes Program Recommendations  AACE/ADA: New Consensus Statement on Inpatient Glycemic Control (2015)  Target Ranges:  Prepandial:   less than 140 mg/dL      Peak postprandial:   less than 180 mg/dL (1-2 hours)      Critically ill patients:  140 - 180 mg/dL   Lab Results  Component Value Date   GLUCAP 300 (H) 01/08/2022   HGBA1C 11.7 (H) 01/17/2021    Review of Glycemic Control  Latest Reference Range & Units 01/08/22 06:12 01/08/22 07:01 01/08/22 08:27 01/08/22 11:22 01/08/22 15:59  Glucose-Capillary 70 - 99 mg/dL 150 (H) 569 (H) 794 (H) 273 (H) 275 (H)   Diabetes history: DM 2 Outpatient Diabetes medications: not taking meds consistently currently (in the past metformin, Glipizide 10 mg bid, Lantus 25 units) Current orders for Inpatient glycemic control:  Semglee 15 units Daily Novolog 0-15 units tid + hs Novolog 4 units tid  A1c pending  Inpatient Diabetes Program Recommendations:    -  Increase Semglee to 25 units, give additional 10 units this morning.  Will see this admission.   1:38 pm addendum: Spoke with pt at bedside. Pt reports following with his PCP 1 year ago and was taken off medication and was able to control his glucose levels through diet. He was able to check his glucose levels periodically. His readings would be around 120's per his report. Pt reports only recently at Thanksgiving that his glucose levels spiked all of a sudden. 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   May need to start Glipizide and SGLT-2 while inpatient to dose for discharge.  Pt needs A1c <10% per DOT, however, pt knows ideally needs an A1c <7%. Next physical due  in April.  Will need benefits check on medications: SGLT-2 (Jardiance, Docia Barrier) Maybe GLP injectables as well. Waiting on current A1c level.  Thanks,  Christena Deem RN, MSN, BC-ADM Inpatient Diabetes Coordinator Team Pager  [phone] (8a-5p)

## 2022-01-08 NOTE — ED Notes (Signed)
Called [facility] and spoke to [place]. Informed that the bed is ready, ready for transport

## 2022-01-08 NOTE — Plan of Care (Signed)
  Problem: Education: Goal: Knowledge of General Education information will improve Description: Including pain rating scale, medication(s)/side effects and non-pharmacologic comfort measures Outcome: Progressing   Problem: Health Behavior/Discharge Planning: Goal: Ability to manage health-related needs will improve Outcome: Progressing   Problem: Clinical Measurements: Goal: Ability to maintain clinical measurements within normal limits will improve Outcome: Progressing Goal: Will remain free from infection Outcome: Progressing Goal: Diagnostic test results will improve Outcome: Progressing Goal: Respiratory complications will improve Outcome: Progressing Goal: Cardiovascular complication will be avoided Outcome: Progressing   Problem: Activity: Goal: Risk for activity intolerance will decrease Outcome: Progressing   Problem: Nutrition: Goal: Adequate nutrition will be maintained Outcome: Progressing   Problem: Coping: Goal: Level of anxiety will decrease Outcome: Progressing   Problem: Elimination: Goal: Will not experience complications related to bowel motility Outcome: Progressing Goal: Will not experience complications related to urinary retention Outcome: Progressing   Problem: Pain Managment: Goal: General experience of comfort will improve Outcome: Progressing   Problem: Safety: Goal: Ability to remain free from injury will improve Outcome: Progressing   Problem: Skin Integrity: Goal: Risk for impaired skin integrity will decrease Outcome: Progressing   Problem: Education: Goal: Ability to describe self-care measures that may prevent or decrease complications (Diabetes Survival Skills Education) will improve Outcome: Progressing Goal: Individualized Educational Video(s) Outcome: Progressing   Problem: Coping: Goal: Ability to adjust to condition or change in health will improve Outcome: Progressing   Problem: Fluid Volume: Goal: Ability to  maintain a balanced intake and output will improve Outcome: Progressing   Problem: Metabolic: Goal: Ability to maintain appropriate glucose levels will improve Outcome: Progressing   Problem: Nutritional: Goal: Maintenance of adequate nutrition will improve Outcome: Progressing Goal: Progress toward achieving an optimal weight will improve Outcome: Progressing   Problem: Skin Integrity: Goal: Risk for impaired skin integrity will decrease Outcome: Progressing   Problem: Tissue Perfusion: Goal: Adequacy of tissue perfusion will improve Outcome: Progressing   

## 2022-01-08 NOTE — ED Notes (Signed)
Pt well tolerated meal and graham crackers -- did not drink diet cola due to taste; provided water instead per pt request

## 2022-01-08 NOTE — ED Notes (Signed)
Pt now sitting up to edge of bed eating a tv dinner (meat loaf/mashed pot/corn/diced apples and has pack of graham crackers with diet cola)

## 2022-01-08 NOTE — ED Notes (Signed)
Per Dr. Cristy Folks d/c Insulin Drip 1 hour after patient has eaten and received Semglee SQ Insulin

## 2022-01-08 NOTE — ED Notes (Signed)
Endotool prompt that blood glucose is stable "consider transition" - Dr. Carollee Leitz notified via secure chat; per ED provider admitting hospitalist to manage transition orders

## 2022-01-09 ENCOUNTER — Other Ambulatory Visit (HOSPITAL_COMMUNITY): Payer: Self-pay

## 2022-01-09 DIAGNOSIS — E1165 Type 2 diabetes mellitus with hyperglycemia: Secondary | ICD-10-CM | POA: Diagnosis not present

## 2022-01-09 LAB — CBC
HCT: 38.7 % — ABNORMAL LOW (ref 39.0–52.0)
Hemoglobin: 13.6 g/dL (ref 13.0–17.0)
MCH: 30.7 pg (ref 26.0–34.0)
MCHC: 35.1 g/dL (ref 30.0–36.0)
MCV: 87.4 fL (ref 80.0–100.0)
Platelets: 301 10*3/uL (ref 150–400)
RBC: 4.43 MIL/uL (ref 4.22–5.81)
RDW: 12 % (ref 11.5–15.5)
WBC: 8.1 10*3/uL (ref 4.0–10.5)
nRBC: 0 % (ref 0.0–0.2)

## 2022-01-09 LAB — BASIC METABOLIC PANEL
Anion gap: 13 (ref 5–15)
BUN: 17 mg/dL (ref 6–20)
CO2: 25 mmol/L (ref 22–32)
Calcium: 9.1 mg/dL (ref 8.9–10.3)
Chloride: 98 mmol/L (ref 98–111)
Creatinine, Ser: 1.18 mg/dL (ref 0.61–1.24)
GFR, Estimated: >60 mL/min (ref >60)
Glucose, Bld: 319 mg/dL — ABNORMAL HIGH (ref 70–99)
Potassium: 3.7 mmol/L (ref 3.5–5.1)
Sodium: 136 mmol/L (ref 135–145)

## 2022-01-09 LAB — HEMOGLOBIN A1C
Hgb A1c MFr Bld: 10.8 % — ABNORMAL HIGH (ref 4.8–5.6)
Mean Plasma Glucose: 263 mg/dL

## 2022-01-09 LAB — GLUCOSE, CAPILLARY
Glucose-Capillary: 324 mg/dL — ABNORMAL HIGH (ref 70–99)
Glucose-Capillary: 314 mg/dL — ABNORMAL HIGH (ref 70–99)
Glucose-Capillary: 315 mg/dL — ABNORMAL HIGH (ref 70–99)

## 2022-01-09 LAB — MAGNESIUM: Magnesium: 1.9 mg/dL (ref 1.7–2.4)

## 2022-01-09 MED ORDER — LANTUS SOLOSTAR 100 UNIT/ML ~~LOC~~ SOPN
30.0000 [IU] | PEN_INJECTOR | Freq: Every day | SUBCUTANEOUS | 0 refills | Status: DC
  Filled 2022-01-09: qty 9, 30d supply, fill #0

## 2022-01-09 MED ORDER — INSULIN GLARGINE-YFGN 100 UNIT/ML ~~LOC~~ SOLN
25.0000 [IU] | Freq: Every day | SUBCUTANEOUS | Status: DC
  Administered 2022-01-09: 25 [IU] via SUBCUTANEOUS
  Filled 2022-01-09: qty 0.25

## 2022-01-09 MED ORDER — INSULIN LISPRO (1 UNIT DIAL) 100 UNIT/ML (KWIKPEN)
PEN_INJECTOR | SUBCUTANEOUS | 0 refills | Status: DC
  Filled 2022-01-09: qty 15, 30d supply, fill #0

## 2022-01-09 MED ORDER — GLUCOSE BLOOD VI STRP
ORAL_STRIP | 0 refills | Status: AC
Start: 2022-01-09 — End: ?
  Filled 2022-01-09: qty 100, 30d supply, fill #0

## 2022-01-09 MED ORDER — TAMSULOSIN HCL 0.4 MG PO CAPS
0.4000 mg | ORAL_CAPSULE | Freq: Every day | ORAL | 0 refills | Status: DC
  Filled 2022-01-09: qty 30, 30d supply, fill #0

## 2022-01-09 MED ORDER — BLOOD GLUCOSE MONITOR KIT
PACK | 0 refills | Status: DC
  Filled 2022-01-09: qty 1, 30d supply, fill #0

## 2022-01-09 MED ORDER — LANCETS 33G MISC
0 refills | Status: DC
  Filled 2022-01-09: qty 100, 30d supply, fill #0

## 2022-01-09 MED ORDER — PENTIPS 32G X 4 MM MISC
0 refills | Status: DC
  Filled 2022-01-09: qty 100, 30d supply, fill #0

## 2022-01-09 MED ORDER — METFORMIN HCL 500 MG PO TABS
500.0000 mg | ORAL_TABLET | Freq: Two times a day (BID) | ORAL | 1 refills | Status: DC
  Filled 2022-01-09: qty 60, 30d supply, fill #0

## 2022-01-09 MED ORDER — PANTOPRAZOLE SODIUM 40 MG PO TBEC
40.0000 mg | DELAYED_RELEASE_TABLET | Freq: Every day | ORAL | 0 refills | Status: AC
Start: 2022-01-09 — End: ?
  Filled 2022-01-09: qty 30, 30d supply, fill #0

## 2022-01-09 NOTE — Discharge Instructions (Addendum)
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  Follow with Primary MD Gwenlyn Fudge, FNP in 7 days   Get CBC, CMP, Magnesium -  checked next visit with your primary MD    Activity: As tolerated with Full fall precautions use walker/cane & assistance as needed  Disposition Home   Diet: Heart Healthy Low Carb  Accuchecks 4 times/day, Once in AM empty stomach and then before each meal. Log in all results and show them to your Prim.MD in 3 days. If any glucose reading is under 80 or above 300 call your Prim MD immidiately. Follow Low glucose instructions for glucose under 80 as instructed.   Special Instructions: If you have smoked or chewed Tobacco  in the last 2 yrs please stop smoking, stop any regular Alcohol  and or any Recreational drug use.  On your next visit with your primary care physician please Get Medicines reviewed and adjusted.  Please request your Prim.MD to go over all Hospital Tests and Procedure/Radiological results at the follow up, please get all Hospital records sent to your Prim MD by signing hospital release before you go home.  If you experience worsening of your admission symptoms, develop shortness of breath, life threatening emergency, suicidal or homicidal thoughts you must seek medical attention immediately by calling 911 or calling your MD immediately  if symptoms less severe.  You Must read complete instructions/literature along with all the possible adverse reactions/side effects for all the Medicines you take and that have been prescribed to you. Take any new Medicines after you have completely understood and accpet all the possible adverse reactions/side effects.

## 2022-01-09 NOTE — TOC Benefit Eligibility Note (Signed)
Patient Product/process development scientist completed.    The patient is currently admitted and upon discharge could be taking Jardiance 10 mg.  The current 30 day co-pay is $110.00.   The patient is currently admitted and upon discharge could be taking Farxiga 10 mg.  Not on Formulary  The patient is currently admitted and upon discharge could be taking Invokana 100 mg.  Not on Formulary  The patient is currently admitted and upon discharge could be taking Ozempic Pens.  Requires Prior Authorization  The patient is currently admitted and upon discharge could be taking Trulicity Pens.  Requires Prior Authorization  The patient is currently admitted and upon discharge could be taking Victozia Pens.  Requires Prior Authorization  The patient is insured through [employer] Medicare Part D     Roland Earl, CPhT Pharmacy Patient Advocate Specialist [facility] Patient Advocate Team Direct Number: [phone]  Fax: [phone]

## 2022-01-09 NOTE — Discharge Summary (Signed)
Ronald Palmer [id number]:[id number] DOB: 09/24/1975 DOA: 01/07/2022  PCP: Loman Brooklyn, FNP  Admit date: 01/07/2022  Discharge date: 01/09/2022  Admitted From: Home   Disposition:  Home   Recommendations for Outpatient Follow-up:   Follow up with PCP in 1-2 weeks  PCP Please obtain BMP/CBC, 2 view CXR in 1week,  (see Discharge instructions)   PCP Please follow up on the following pending results:    Home Health: None   Equipment/Devices: None  Consultations: None  Discharge Condition: Stable    CODE STATUS: Full    Diet Recommendation: Heart Healthy Low Carb    Chief Complaint  Patient presents with   Hyperglycemia     Brief history of present illness from the day of admission and additional interim summary     46 y.o. male with medical history significant of diabetes mellitus type 2, nephrolithiasis, and obesity who presented for elevated blood sugars.  He is a Administrator.  Patient states that he has been keeping his blood sugars under control with diet and not been using any medications.  He reported that he been checking them intermittently and they have been well-controlled up until Thanksgiving.  He reports that his blood sugars had been elevated up to 495, and he had not been able to get them to come down despite taking metformin.  Reports having urinary frequency, dry mouth, and lightheadedness.  Was diagnosed with extremely high blood sugars with mild DKA and was admitted in hospital.                                                                 Hospital Course   DKA, in DM type II Resolved.  He is noncompliant with his out patient medication regimen, he has been counseled on compliance diabetic insulin education provided, he has not been taking his medications for months, now placed on Glucophage  along with long-acting insulin and sliding scale, testing supplies and medications provided for mild PCP to monitor closely.  Requested to check CBGs q. [facility] S.  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  Also requested to inform his employer that he is now on insulin.   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  Follow with Primary MD Loman Brooklyn, FNP in 7 days   Get CBC, CMP, Magnesium -  checked next visit with your primary MD    Activity: As tolerated with Full fall precautions use walker/cane & assistance as needed  Disposition Home   Diet: Heart Healthy Low Carb  Accuchecks 4 times/day, Once in AM empty stomach and then before each meal. Log in all results and show them to your Prim.MD in 3 days. If any glucose reading is under 80 or above 300 call your Prim MD immidiately. Follow Low glucose instructions for glucose under 80 as instructed.   Special Instructions: If you have smoked or chewed Tobacco  in the last 2 yrs please stop smoking, stop any regular Alcohol  and or any Recreational drug use.  On your next visit with your primary care physician please Get Medicines reviewed and adjusted.  Please request your Prim.MD to go over all Hospital Tests and Procedure/Radiological results at the follow up, please get all Hospital records sent to your Prim MD by signing hospital release before you go home.  If you experience worsening of  your admission symptoms, develop shortness of breath, life threatening emergency, suicidal or homicidal thoughts you must seek medical attention immediately by calling 911 or calling your MD immediately  if symptoms less severe.  You Must read complete instructions/literature along with all the possible adverse reactions/side effects for all the Medicines you take and that have been prescribed to you. Take any new Medicines after you have completely understood and accpet all the possible adverse reactions/side effects.       Discharge Medications   Allergies as of 01/09/2022   No Known Allergies      Medication List     STOP taking these medications    glipiZIDE 10 MG tablet Commonly known as: GLUCOTROL       TAKE these medications    blood glucose meter kit and supplies Kit DM testing supplies/kit ( glucometer, lancets, strips)  for QAC - HS accuchecks. Can give 1 month testing supplies for QA CHS Accu-Cheks of any brand.   insulin aspart 100 UNIT/ML FlexPen Commonly known as: NOVOLOG Before each meal 3 times a day, 140-199 - 2 units, 200-250 - 4 units, 251-299 - 8 units,  300-349 - 10 units,  350 or above 12 units. Insulin PEN if approved, provide syringes and needles if needed.Please switch to any approved short acting Insulin if needed.   Insulin Syringe-Needle U-100 25G X 1" 1 ML Misc For 4 times a day insulin SQ, 1 month supply. Diagnosis E11.65   Lantus SoloStar 100 UNIT/ML Solostar Pen Generic drug: insulin glargine Inject 30 Units into the skin daily. What changed: how much to take   metFORMIN 500 MG tablet Commonly known as: GLUCOPHAGE Take 1 tablet (500 mg total) by mouth 2 (two) times daily with a meal.   multivitamin tablet Take 1 tablet by mouth in the morning.   pantoprazole 40 MG tablet Commonly known as: PROTONIX Take 1 tablet (40 mg total) by mouth daily at 6 (six) AM.   tamsulosin 0.4 MG Caps capsule Commonly known as: FLOMAX Take 1 capsule  (0.4 mg total) by mouth daily.         Follow-up Information     Loman Brooklyn, FNP. Schedule an appointment as soon as possible for a visit in 1 week(s).   Specialty: Family Medicine Contact information: [address] [place] [address] [phone]  Major procedures and Radiology Reports - PLEASE review detailed and final reports thoroughly  -     Today   Subjective    Ronald Palmer today has no headache,no chest abdominal pain,no new weakness tingling or numbness, feels much better wants to go home today.    Objective   Blood pressure 120/65, pulse 68, temperature 98.3 F (36.8 C), temperature source Oral, resp. rate 14, height _0  (1.676 m), weight 111 kg, SpO2 96 %.  No intake or output data in the 24 hours ending 01/09/22 1013  Exam  Awake Alert, No new F.N deficits,    [place].AT,PERRAL Supple Neck,   Symmetrical Chest wall movement, Good air movement bilaterally, CTAB RRR,No Gallops,   +ve B.Sounds, Abd Soft, Non tender,  No Cyanosis, Clubbing or edema    Data Review   Recent Labs  Lab 01/07/22 1852 01/07/22 2011 01/09/22 0611  WBC 13.6*  --  8.1  HGB 16.4 15.3 13.6  HCT 46.4 45.0 38.7*  PLT 451*  --  301  MCV 85.6  --  87.4  MCH 30.3  --  30.7  MCHC 35.3  --  35.1  RDW 12.2  --  12.0    Recent Labs  Lab 01/07/22 1852 01/07/22 2011 01/08/22 0216 01/08/22 1437 01/09/22 0611  NA 129* 131* 136  --  136  K 4.1 3.9 3.0*  --  3.7  MG  --   --   --   --  1.9  CL 92*  --  101  --  98  CO2 19*  --  24  --  25  GLUCOSE 490*  --  176*  --  319*  BUN 22*  --  18  --  17  CREATININE 1.27*  --  0.99  --  1.18  CALCIUM 10.0  --  9.2  --  9.1  HGBA1C  --   --   --  10.8*  --     Total Time in preparing paper work, data evaluation and todays exam - 35 minutes  Signature  -    Lala Lund M.D on 01/09/2022 at 10:13 AM   -  To page go to [email or web address]

## 2022-01-09 NOTE — Care Plan (Signed)
                                      [facility]                            [address] [place], [place] [address]      Ronald Palmer was admitted to the Hospital on 01/07/2022 and Discharged  01/09/2022 and should be excused from work/school   for 4 days starting from date -  01/07/2022 , may return to work/school without any restrictions if Insulin for Diabetes management is allowed at his work.  Call Susa Raring MD, [facility]  [phone] with questions.  Susa Raring M.D on 01/09/2022,at 3:56 PM  [facility]   Office  [phone]

## 2022-01-10 ENCOUNTER — Telehealth: Payer: Self-pay

## 2022-01-10 NOTE — Telephone Encounter (Signed)
Transition Care Management Follow-up Telephone Call Date of discharge and from where: TCM [facility] 01-09-22 Dx :DKA  How have you been since you were released from the hospital? Doing ok  Any questions or concerns? No  Items Reviewed: Did the pt receive and understand the discharge instructions provided? Yes  Medications obtained and verified? Yes  Other? No  Any new allergies since your discharge? No  Dietary orders reviewed? Yes Do you have support at home? Yes   Home Care and Equipment/Supplies: Were home health services ordered? no If so, what is the name of the agency? na  Has the agency set up a time to come to the patient's home? not applicable Were any new equipment or medical supplies ordered?  No What is the name of the medical supply agency? na Were you able to get the supplies/equipment? not applicable Do you have any questions related to the use of the equipment or supplies? No  Functional Questionnaire: (I = Independent and D = Dependent) ADLs: I  Bathing/Dressing- I  Meal Prep- I  Eating- I  Maintaining continence- I  Transferring/Ambulation- I  Managing Meds- I  Follow up appointments reviewed:  PCP Hospital f/u appt confirmed? Yes  Scheduled to see Harlow Mares FNP [facility]? No  . Are transportation arrangements needed? No  If their condition worsens, is the pt aware to call PCP or go to the Emergency Dept.? Yes Was the patient provided with contact information for the PCP's office or ED? Yes Was to pt encouraged to call back with questions or concerns? Yes   Woodfin Ganja LPN [facility] Nurse Health Advisor Direct Dial [phone]

## 2022-01-11 ENCOUNTER — Encounter: Payer: Self-pay | Admitting: Family Medicine

## 2022-01-11 ENCOUNTER — Telehealth: Payer: Self-pay | Admitting: Family Medicine

## 2022-01-11 ENCOUNTER — Ambulatory Visit: Payer: 59 | Admitting: Family Medicine

## 2022-01-11 VITALS — BP 111/73 | HR 77 | Temp 98.1°F | Ht 66.0 in | Wt 257.1 lb

## 2022-01-11 DIAGNOSIS — E1165 Type 2 diabetes mellitus with hyperglycemia: Secondary | ICD-10-CM | POA: Diagnosis not present

## 2022-01-11 DIAGNOSIS — Z09 Encounter for follow-up examination after completed treatment for conditions other than malignant neoplasm: Secondary | ICD-10-CM | POA: Diagnosis not present

## 2022-01-11 DIAGNOSIS — E1110 Type 2 diabetes mellitus with ketoacidosis without coma: Secondary | ICD-10-CM

## 2022-01-11 NOTE — Progress Notes (Signed)
Established Patient Office Visit  Subjective   Patient ID: Ronald Palmer, male    DOB: 1975-02-17  Age: 46 y.o. MRN: [id number]  Chief Complaint  Patient presents with   Hospitalization Follow-up    HPI Ronald Palmer is here today for a hospital discharge follow up.   Today's visit was for Transitional Care Management.  The patient was discharged from [facility] on 12/2821 with a primary diagnosis of DKA.   Contact with the patient and/or caregiver, by a clinical staff member, was made on 01/10/22 and was documented as a telephone encounter within the EMR.  Through chart review and discussion with the patient I have determined that management of their condition is of moderate complexity.   Ronald Palmer presented to the ER on 01/07/22 for elevated blood sugars. He was having blood sugars up to 495 despite restarting his metformin. He was having dry mouth, lightheadedness, and urinary frequency. He was found to be in mild DKA and every admitted for management. He was discharged with lantus 30 u daily, and a sliding scale with humalog for meals. He was instructed to check his blood sugar fasting and 3x a day before miles. 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 He was instructed to inform his employer that he is now on insulin. He also has reactionary leukocytois/thrombocytosis which results. His hypokalemia also resolved. His A1c was 10.8.  Since discharge he has been taking lantus 14 u daily and has not been taking his meal time insulin. He has not checked his blood sugar since yesterday. Reports blood sugars were 230-300s yesterday.   He has a history of T2DM and has been noncompliant with follow up and medications. He had also been admitted in December of 2022 for DKA with new dx of DM. He went to 1 follow up appt at another family practice and then did not continue to follow up. He reports that he fixed his diet and that this controlled his DM in the meantinme.   Current  symptoms include increase appetite, polydipsia, and polyuria. Patient denies foot ulcerations, hypoglycemia , nausea, paresthesia of the feet, visual disturbances, vomiting, and weight loss.     ROS Negative unless specially indicated above in HPI.   Objective:     BP 111/73   Pulse 77   Temp 98.1 F (36.7 C) (Temporal)   Ht 5' 6" (1.676 m)   Wt 257 lb 2 oz (116.6 kg)   SpO2 96%   BMI 41.50 kg/m    Physical Exam Vitals and nursing note reviewed.  Constitutional:      General: He is not in acute distress.    Appearance: He is not ill-appearing, toxic-appearing or diaphoretic.  Cardiovascular:     Rate and Rhythm: Normal rate and regular rhythm.     Heart sounds: Normal heart sounds. No murmur heard. Pulmonary:     Effort: Pulmonary effort is normal. No respiratory distress.     Breath sounds: Normal breath sounds.  Abdominal:     General: Bowel sounds are normal. There is no distension.     Palpations: Abdomen is soft.     Tenderness: There is no abdominal tenderness. There is no guarding or rebound.  Musculoskeletal:     Right lower leg: No edema.     Left lower leg: No edema.  Skin:    General: Skin is warm and dry.  Neurological:     General: No focal deficit present.  Mental Status: He is alert and oriented to person, place, and time.  Psychiatric:        Mood and Affect: Mood normal.        Behavior: Behavior normal.      No results found for any visits on 01/11/22.    The 10-year ASCVD risk score (Arnett DK, et al., 2019) is: 5%    Assessment & Plan:   Ronald Palmer was seen today for hospitalization follow-up.  Diagnoses and all orders for this visit:  Diabetic ketoacidosis without coma associated with type 2 diabetes mellitus ([place]) Uncontrolled type 2 diabetes mellitus with hyperglycemia ([place]) Was admitted for treatment of DKA due to DM. Uncontrolled with A1c of 10.8 on admission. He was discharged with basal insuling and mealtime insulin. He has  not been taking mealtime insulin and has only been taking half of the prescribed basal insulin. He has not been checking blood sugars as instructed. Education provided today regarding diagnosis and disease progression, compliance with insulin, and monitoring blood sugars. 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 Referral to endocrinology placed today. Will have him schedule an appointment with Almyra Free for management in a few weeks while waiting to get in with endo. Discussed will need diabetic eye exam. Will do foot exam at next appt if he has not yet seen endo.  -     Ambulatory referral to Endocrinology -     CMP14+EGFR -     CBC with Differential/Platelet -     Microalbumin / creatinine urine ratio  Morbid obesity (HCC) Diet, exercise.   Hospital discharge follow-up Reviewed hospital discharge records and labs.   Return for schedule with Almyra Free for new DM. 6 week follow up with me.   The patient indicates understanding of these issues and agrees with the plan.   Gwenlyn Perking, FNP

## 2022-01-13 LAB — MICROALBUMIN / CREATININE URINE RATIO
Creatinine, Urine: 77.8 mg/dL
Microalb/Creat Ratio: 21 mg/g creat (ref 0–29)
Microalbumin, Urine: 16.1 ug/mL

## 2022-01-18 ENCOUNTER — Telehealth: Payer: Self-pay | Admitting: Family Medicine

## 2022-01-18 NOTE — Telephone Encounter (Signed)
Patient aware.

## 2022-01-18 NOTE — Telephone Encounter (Signed)
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

## 2022-01-18 NOTE — Telephone Encounter (Signed)
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

## 2022-01-25 ENCOUNTER — Telehealth: Payer: Self-pay | Admitting: Family Medicine

## 2022-01-25 NOTE — Telephone Encounter (Signed)
Reviewed medications with patient  Patient reports FBG 172, 179, 170s Post prandials as high as 220 Not taking any humalog -- reviewed scale and reinforced importance of checking BG before meals then giving humalog Continue metformin--hopefully side effects will subside; has only been taking 1 tablet daily (at most) Consider XR formulation Gave patient number to endocrine to make an appt   Reinforced importance of compliance and follow up

## 2022-02-07 ENCOUNTER — Telehealth: Payer: Self-pay | Admitting: Family Medicine

## 2022-02-07 ENCOUNTER — Ambulatory Visit (INDEPENDENT_AMBULATORY_CARE_PROVIDER_SITE_OTHER): Payer: 59 | Admitting: Nurse Practitioner

## 2022-02-07 ENCOUNTER — Encounter: Payer: Self-pay | Admitting: Nurse Practitioner

## 2022-02-07 VITALS — BP 120/90 | HR 67 | Ht 66.0 in | Wt 246.8 lb

## 2022-02-07 DIAGNOSIS — E1165 Type 2 diabetes mellitus with hyperglycemia: Secondary | ICD-10-CM | POA: Diagnosis not present

## 2022-02-07 DIAGNOSIS — Z794 Long term (current) use of insulin: Secondary | ICD-10-CM | POA: Diagnosis not present

## 2022-02-07 LAB — POCT GLYCOSYLATED HEMOGLOBIN (HGB A1C): Hemoglobin A1C: 10 % — AB (ref 4.0–5.6)

## 2022-02-07 NOTE — Progress Notes (Signed)
Endocrinology Consult Note       02/07/2022, 10:39 AM   Subjective:    Patient ID: Ronald Palmer, male    DOB: Dec 31, 1975.  Ronald Palmer is being seen in consultation for management of currently uncontrolled symptomatic diabetes requested by  Gwenlyn Perking, FNP.   Past Medical History:  Diagnosis Date   History of kidney stones    Renal disorder     Past Surgical History:  Procedure Laterality Date   LITHOTRIPSY      Social History   Socioeconomic History   Marital status: Legally Separated    Spouse name: Not on file   Number of children: Not on file   Years of education: Not on file   Highest education level: Not on file  Occupational History   Not on file  Tobacco Use   Smoking status: Never   Smokeless tobacco: Current    Types: Chew  Vaping Use   Vaping Use: Never used  Substance and Sexual Activity   Alcohol use: No   Drug use: Never   Sexual activity: Not on file  Other Topics Concern   Not on file  Social History Narrative   Not on file   Social Determinants of Health   Financial Resource Strain: Not on file  Food Insecurity: No Food Insecurity (01/08/2022)   Hunger Vital Sign    Worried About Running Out of Food in the Last Year: Never true    Ran Out of Food in the Last Year: Never true  Transportation Needs: No Transportation Needs (01/08/2022)   PRAPARE - Hydrologist (Medical): No    Lack of Transportation (Non-Medical): No  Physical Activity: Not on file  Stress: Not on file  Social Connections: Not on file    Family History  Problem Relation Age of Onset   Other Mother        Bone disease   Dementia Father    Heart disease Maternal Grandmother    Rheum arthritis Maternal Grandmother    Heart attack Maternal Grandfather    Alcohol abuse Paternal Grandfather    Dementia Paternal Grandfather    Prostate cancer Paternal Uncle      Outpatient Encounter Medications as of 02/07/2022  Medication Sig   blood glucose meter kit and supplies KIT Use as directed up to four times daily.   glucose blood test strip Use as directed up to four times daily   Lancets 33G MISC Use as directed up to four times daily   metFORMIN (GLUCOPHAGE) 500 MG tablet Take 1 tablet (500 mg total) by mouth 2 (two) times daily with a meal.   [DISCONTINUED] insulin glargine (LANTUS SOLOSTAR) 100 UNIT/ML Solostar Pen Inject 30 Units into the skin daily.   [DISCONTINUED] insulin lispro (HUMALOG KWIKPEN) 100 UNIT/ML KwikPen Before each meal 3 times a day, 140-199 - 2 units, 200-250 - 4 units, 251-299 - 8 units,  300-349 - 10 units,  350 or above 12 units.   [DISCONTINUED] Insulin Pen Needle (PENTIPS) 32G X 4 MM MISC Use as directed up to four times daily   Multiple Vitamin (MULTIVITAMIN) tablet Take 1 tablet by mouth in the morning. (  Patient not taking: Reported on 02/07/2022)   pantoprazole (PROTONIX) 40 MG tablet Take 1 tablet (40 mg total) by mouth daily at 6 (six) AM. (Patient not taking: Reported on 02/07/2022)   tamsulosin (FLOMAX) 0.4 MG CAPS capsule Take 1 capsule (0.4 mg total) by mouth daily. (Patient not taking: Reported on 01/11/2022)   No facility-administered encounter medications on file as of 02/07/2022.    ALLERGIES: No Known Allergies  VACCINATION STATUS:  There is no immunization history on file for this patient.  Diabetes He presents for his initial diabetic visit. He has type 2 diabetes mellitus. His disease course has been improving. There are no hypoglycemic associated symptoms. Associated symptoms include weight loss (intentional). There are no hypoglycemic complications. There are no diabetic complications. Risk factors for coronary artery disease include diabetes mellitus, dyslipidemia, male sex, obesity, hypertension, sedentary lifestyle and tobacco exposure. Current diabetic treatment includes oral agent (monotherapy) and  diet. He is compliant with treatment most of the time. His weight is decreasing steadily. He is following a generally healthy diet. Meal planning includes avoidance of concentrated sweets and ADA exchanges. He has not had a previous visit with a dietitian. He rarely participates in exercise. His home blood glucose trend is decreasing steadily. His breakfast blood glucose range is generally 110-130 mg/dl. His lunch blood glucose range is generally 140-180 mg/dl. His dinner blood glucose range is generally 140-180 mg/dl. His overall blood glucose range is 140-180 mg/dl. (He presents today for his consultation with his logs showing mostly at target glycemic profile.  His most recent A1c on 11/27 was 10.8%.  He asked that we recheck today so that he could hopefully return to work and his POCT A1c was 10%.  He notes he was diagnosed with diabetes about a year ago and made changes to his diet and was able to stay off medications.  He notes he fell into old bad habits previously prompting his DKA episode.  Since then, he has cut out all processed sugar from his diet.  He monitors glucose 3 times daily.  He drinks only water and eats 2 large meals per day, he also cut out snacking.  He does not engage in routine physical activity at this time but has plans to get back in the gym.  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  He does not want to be on insulin in the future, has not taken it in the last 2 weeks or more.  He has only been taking 1 Metformin a day, says he does have diarrhea associated with it.  He is UTD on eye exam.) An ACE inhibitor/angiotensin II receptor blocker is not being taken. He does not see a podiatrist.Eye exam is current.     Review of systems  Constitutional: + decreasing body weight-intentional, current Body mass index is 39.83 kg/m., no fatigue, no subjective hyperthermia, no subjective hypothermia Eyes: no blurry vision, no  xerophthalmia ENT: no sore throat, no nodules palpated in throat, no dysphagia/odynophagia, no hoarseness Cardiovascular: no chest pain, no shortness of breath, no palpitations, no leg swelling Respiratory: no cough, no shortness of breath Gastrointestinal: no nausea/vomiting/diarrhea Musculoskeletal: no muscle/joint aches Skin: no rashes, no hyperemia Neurological: no tremors, no numbness, no tingling, no dizziness Psychiatric: no depression, no anxiety  Objective:     BP (!) 120/90 (BP Location: Right Arm, Patient Position: Sitting, Cuff Size: Normal)   Pulse 67   Ht 5' 6" (1.676 m)  Wt 246 lb 12.8 oz (111.9 kg)   BMI 39.83 kg/m   Wt Readings from Last 3 Encounters:  02/07/22 246 lb 12.8 oz (111.9 kg)  01/11/22 257 lb 2 oz (116.6 kg)  01/08/22 244 lb 11.4 oz (111 kg)     BP Readings from Last 3 Encounters:  02/07/22 (!) 120/90  01/11/22 111/73  01/09/22 120/68     Physical Exam- Limited  Constitutional:  Body mass index is 39.83 kg/m. , not in acute distress, normal state of mind Eyes:  EOMI, no exophthalmos Neck: Supple Cardiovascular: RRR, no murmurs, rubs, or gallops, no edema Respiratory: Adequate breathing efforts, no crackles, rales, rhonchi, or wheezing Musculoskeletal: no gross deformities, strength intact in all four extremities, no gross restriction of joint movements Skin:  no rashes, no hyperemia Neurological: no tremor with outstretched hands    CMP ( most recent) CMP     Component Value Date/Time   NA 136 01/09/2022 0611   K 3.7 01/09/2022 0611   CL 98 01/09/2022 0611   CO2 25 01/09/2022 0611   GLUCOSE 319 (H) 01/09/2022 0611   BUN 17 01/09/2022 0611   CREATININE 1.18 01/09/2022 0611   CALCIUM 9.1 01/09/2022 0611   PROT 7.2 01/18/2021 0004   ALBUMIN 3.9 01/18/2021 0004   AST 28 01/18/2021 0004   ALT 43 01/18/2021 0004   ALKPHOS 96 01/18/2021 0004   BILITOT 1.2 01/18/2021 0004   GFRNONAA >60 01/09/2022 0611   GFRAA >60 06/02/2019 0317      Diabetic Labs (most recent): Lab Results  Component Value Date   HGBA1C 10.0 (A) 02/07/2022   HGBA1C 10.8 (H) 01/08/2022   HGBA1C 11.7 (H) 01/17/2021     Lipid Panel ( most recent) Lipid Panel  No results found for: "CHOL", "TRIG", "HDL", "CHOLHDL", "VLDL", "LDLCALC", "LDLDIRECT", "LABVLDL"    No results found for: "TSH", "FREET4"         Assessment & Plan:   1) Type 2 diabetes mellitus with hyperglycemia, with long-term current use of insulin ([place])  He presents today for his consultation with his logs showing mostly at target glycemic profile.  His most recent A1c on 11/27 was 10.8%.  He asked that we recheck today so that he could hopefully return to work and his POCT A1c was 10%.  He notes he was diagnosed with diabetes about a year ago and made changes to his diet and was able to stay off medications.  He notes he fell into old bad habits previously prompting his DKA episode.  Since then, he has cut out all processed sugar from his diet.  He monitors glucose 3 times daily.  He drinks only water and eats 2 large meals per day, he also cut out snacking.  He does not engage in routine physical activity at this time but has plans to get back in the gym.  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  He does not want to be on insulin in the future, has not taken it in the last 2 weeks or more.  He has only been taking 1 Metformin a day, says he does have diarrhea associated with it.  He is UTD on eye exam.  - Ronald Palmer has currently uncontrolled symptomatic type 2 DM since 46 years of age, with most recent A1c of 10 %.   -Recent labs reviewed.  - I had a long discussion with him about the progressive nature of  diabetes and the pathology behind its complications. -his diabetes is complicated by several incidences of DKA and he remains at a high risk for more acute and chronic complications which include CAD, CVA, CKD, retinopathy, and  neuropathy. These are all discussed in detail with him.  The following Lifestyle Medicine recommendations according to [facility]) were discussed and offered to patient and he agrees to start the journey:  A. Whole Foods, Plant-based plate comprising of fruits and vegetables, plant-based proteins, whole-grain carbohydrates was discussed in detail with the patient.   A list for source of those nutrients were also provided to the patient.  Patient will use only water or unsweetened tea for hydration. B.  The need to stay away from risky substances including alcohol, smoking; obtaining 7 to 9 hours of restorative sleep, at least 150 minutes of moderate intensity exercise weekly, the importance of healthy social connections,  and stress reduction techniques were discussed. C.  A full color page of  Calorie density of various food groups per pound showing examples of each food groups was provided to the patient.  - I have counseled him on diet and weight management by adopting a carbohydrate restricted/protein rich diet. Patient is encouraged to switch to unprocessed or minimally processed complex starch and increased protein intake (animal or plant source), fruits, and vegetables. -  he is advised to stick to a routine mealtimes to eat 3 meals a day and avoid unnecessary snacks (to snack only to correct hypoglycemia).   - he acknowledges that there is a room for improvement in his food and drink choices. - Suggestion is made for him to avoid simple carbohydrates from his diet including Cakes, Sweet Desserts, Ice Cream, Soda (diet and regular), Sweet Tea, Candies, Chips, Cookies, Store Bought Juices, Alcohol in Excess of 1-2 drinks a day, Artificial Sweeteners, Coffee Creamer, and "Sugar-free" Products. This will help patient to have more stable blood glucose profile and potentially avoid unintended weight gain.  - I have approached him with the following individualized plan  to manage his diabetes and patient agrees:    -he is encouraged to continue monitoring glucose 2 times daily, before breakfast and before bed, and to call the clinic if he has readings less than 70 or above 200 for 3 tests in a row.  - he is warned not to take insulin without proper monitoring per orders. - Adjustment parameters are given to him for hypo and hyperglycemia in writing. - he is encouraged to call clinic for blood glucose levels less than 70 or above 300 mg /dl. - he is advised to continue Metformin 500 mg po daily after breakfast (hopefully will help with GI upset), therapeutically suitable for patient .  He is motivated by his desire to stay off his insulin  - he is not an ideal candidate for incretin therapy due to significantly elevated triglycerides and smokeless tobacco history increasing his risk of pancreatitis.  - Specific targets for  A1c; LDL, HDL, and Triglycerides were discussed with the patient.  2) Blood Pressure /Hypertension:  his blood pressure is controlled to target without the use of antihypertensive medications.  3) Lipids/Hyperlipidemia:    Review of his recent lipid panel from 01/25/21 showed uncontrolled LDL at 102 and significantly elevated triglycerides of 515 . He is not currently on any lipid lowering medications.  Will recheck lipid panel on subsequent visits.  4)  Weight/Diet:  his Body mass index is 39.83 kg/m.  -  clearly complicating  his diabetes care.   he is a candidate for weight loss. I discussed with him the fact that loss of 5 - 10% of his  current body weight will have the most impact on his diabetes management.  Exercise, and detailed carbohydrates information provided  -  detailed on discharge instructions.  5) Chronic Care/Health Maintenance: -he is not on ACEI/ARB or Statin medications and is encouraged to initiate and continue to follow up with Ophthalmology, Dentist, Podiatrist at least yearly or according to recommendations, and  advised to stay away from tobacco. I have recommended yearly flu vaccine and pneumonia vaccine at least every 5 years; moderate intensity exercise for up to 150 minutes weekly; and sleep for at least 7 hours a day.  - he is advised to maintain close follow up with Gwenlyn Perking, FNP for primary care needs, as well as his other providers for optimal and coordinated care.   -A copy of today's visit will be faxed to his short-term disability manager.   - Time spent in this patient care: 60 min, of which > 50% was spent in counseling him about his diabetes and the rest reviewing his blood glucose logs, discussing his hypoglycemia and hyperglycemia episodes, reviewing his current and previous labs/studies (including abstraction from other facilities) and medications doses and developing a long term treatment plan based on the latest standards of care/guidelines; and documenting his care.    Please refer to Patient Instructions for Blood Glucose Monitoring and Insulin/Medications Dosing Guide" in media tab for additional information. Please also refer to "Patient Self Inventory" in the Media tab for reviewed elements of pertinent patient history.  Tilman Neat participated in the discussions, expressed understanding, and voiced agreement with the above plans.  All questions were answered to his satisfaction. he is encouraged to contact clinic should he have any questions or concerns prior to his return visit.     Follow up plan: - Return in about 3 months (around 05/09/2022) for Diabetes F/U with A1c in office, No previsit labs, Bring meter and logs.    Rayetta Pigg, [facility] [address] [place], [place] [address] Phone: [phone] Fax: [phone]  02/07/2022, 10:39 AM

## 2022-02-07 NOTE — Patient Instructions (Signed)

## 2022-02-08 ENCOUNTER — Telehealth: Payer: Self-pay | Admitting: Family Medicine

## 2022-02-08 NOTE — Telephone Encounter (Signed)
Lm mking pt aware

## 2022-02-08 NOTE — Telephone Encounter (Signed)
Per endo note, it looks like they sent a form for STD?

## 2022-02-08 NOTE — Telephone Encounter (Signed)
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

## 2022-02-08 NOTE — Telephone Encounter (Signed)
Reviewed Tiffanys advise with patient and patient says that he was told by the Endocrinologist that he needed to contact his PCP about getting a DOT since we were the ones to cancel his appt for it.

## 2022-02-08 NOTE — Telephone Encounter (Signed)
Patient wants to know if PCP can extend his STD. Patient has been since 02/02/2022 and has only received one check. Says in order to get paid, he needs his STD extended.

## 2022-02-08 NOTE — Telephone Encounter (Signed)
He should discuss this with endocrinology as they are now managing his diabetes.

## 2022-02-09 NOTE — Telephone Encounter (Signed)
R/c

## 2022-02-13 ENCOUNTER — Encounter: Payer: Self-pay | Admitting: Nurse Practitioner

## 2022-02-13 ENCOUNTER — Telehealth: Payer: Self-pay | Admitting: Nurse Practitioner

## 2022-02-13 NOTE — Telephone Encounter (Signed)
I asked him if he had any forms and he said no. I told him if you did this it would be on a letter head from here

## 2022-02-13 NOTE — Telephone Encounter (Signed)
Do they have specific document they need me to fill out?  Or are they just requiring me to write a letter saying he can return to work?

## 2022-02-13 NOTE — Telephone Encounter (Signed)
Ok, I will write a letter.  I sent it to your email to get added to our letterhead.

## 2022-02-13 NOTE — Telephone Encounter (Signed)
Pt states he needs something in writing stating that he can return back to work. Please Advise. He states his employer is requesting this. You already did some paperwork for his STD

## 2022-02-15 NOTE — Telephone Encounter (Signed)
Left vm for cb

## 2022-02-19 NOTE — Telephone Encounter (Signed)
Pt aware forms have been sent in

## 2022-02-22 ENCOUNTER — Ambulatory Visit: Payer: 59 | Admitting: Family Medicine

## 2022-03-02 NOTE — Telephone Encounter (Signed)
Pt had multiple telephone encounters open regarding this, please see other call.

## 2022-03-19 ENCOUNTER — Ambulatory Visit: Payer: 59 | Admitting: Family Medicine

## 2022-05-09 ENCOUNTER — Ambulatory Visit: Payer: 59 | Admitting: Nurse Practitioner

## 2022-05-22 ENCOUNTER — Ambulatory Visit: Payer: 59 | Admitting: Nurse Practitioner

## 2022-10-19 ENCOUNTER — Telehealth: Payer: Self-pay | Admitting: Family Medicine

## 2022-10-19 NOTE — Telephone Encounter (Signed)
Spoke with pt to see if he wanted to schedule a diabetic eye exam here at [facility]. Pt acted as if he could speak spanish and then said I had the wrong number. Will not call back. Marked as declined.

## 2022-11-12 IMAGING — CT CT RENAL STONE PROTOCOL
2 of 4 series · 17 of 46 positions shown, 19 images · non-contrast
Comparison: 04/10/2019

CLINICAL DATA: Flank pain. Kidney stone suspected. Dizziness.
Frequent urination. Lower abdominal pain.

EXAM:
CT ABDOMEN AND PELVIS WITHOUT CONTRAST
TECHNIQUE: Multidetector CT imaging of the abdomen and pelvis was performed
following the standard protocol without IV contrast.

[Series 2: axial st · axial · 0.89mm/px · z∈[+914,+1339]mm · 14 of 99 slices shown, 16 images]
[im 7/99  soft-tissue]
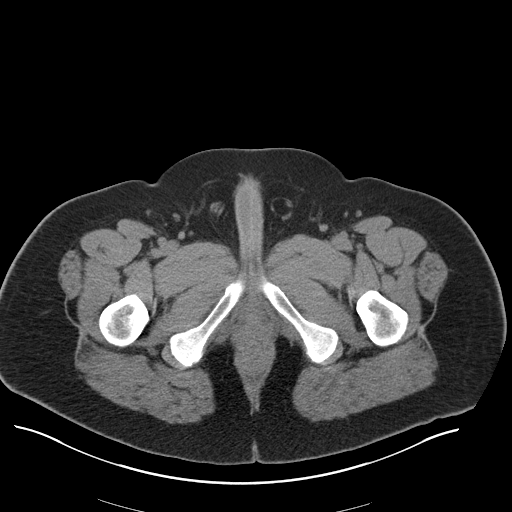
[im 7/99  bone]
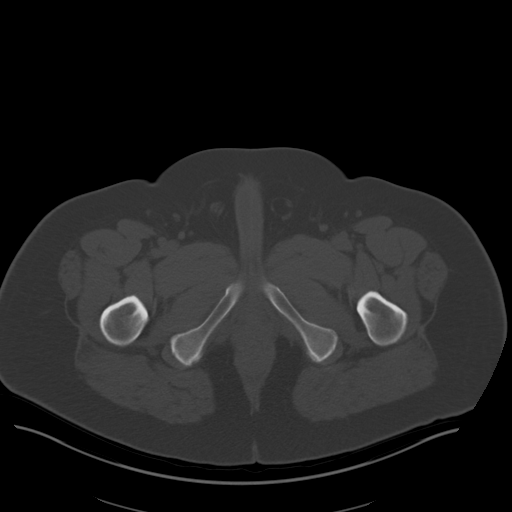
[im 14/99  soft-tissue]
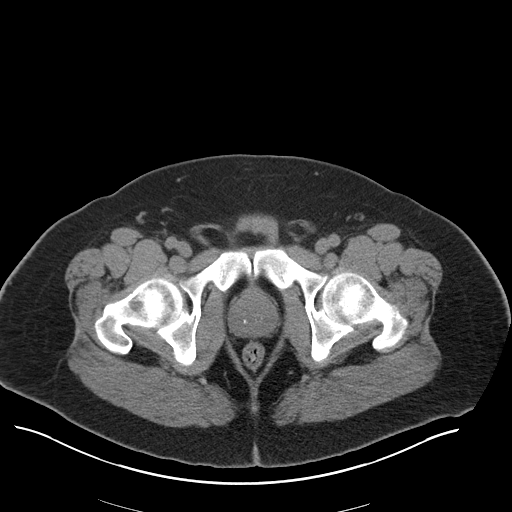
[im 20/99  soft-tissue]
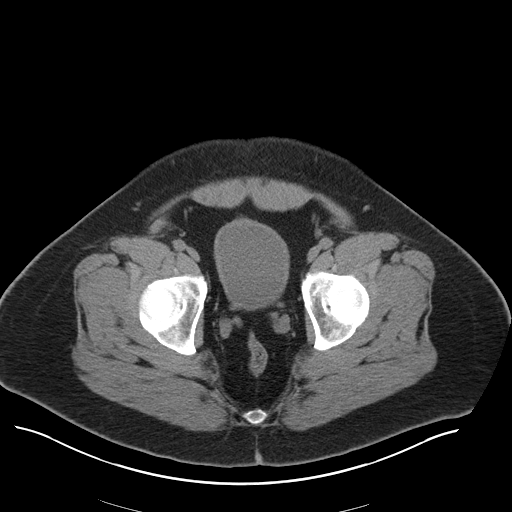
[im 27/99  soft-tissue]
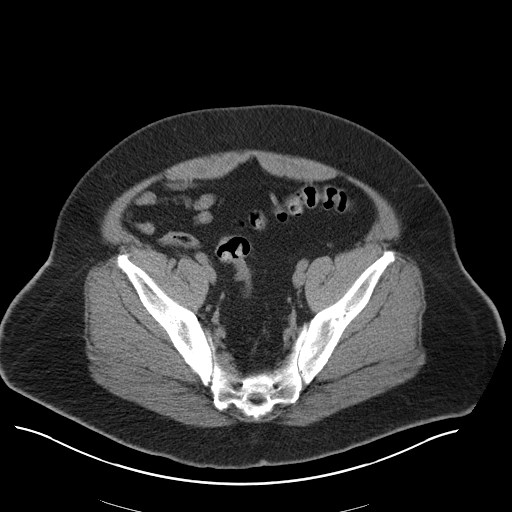
[im 33/99  soft-tissue]
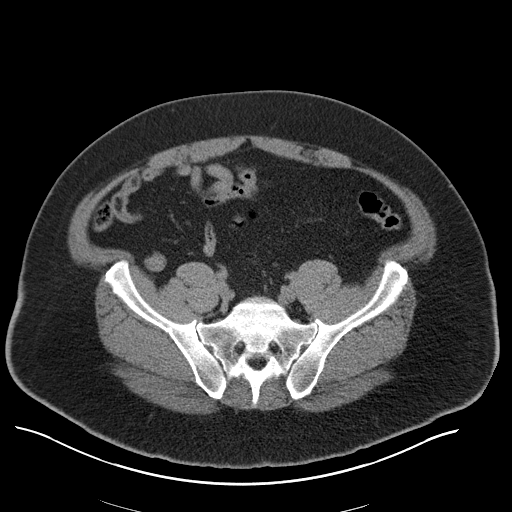
[im 40/99  soft-tissue]
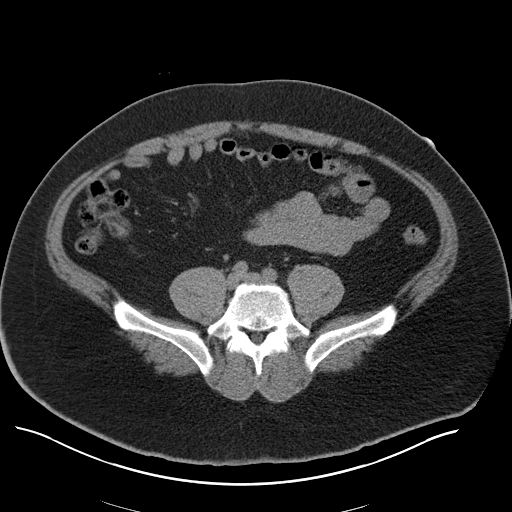
[im 46/99  soft-tissue]
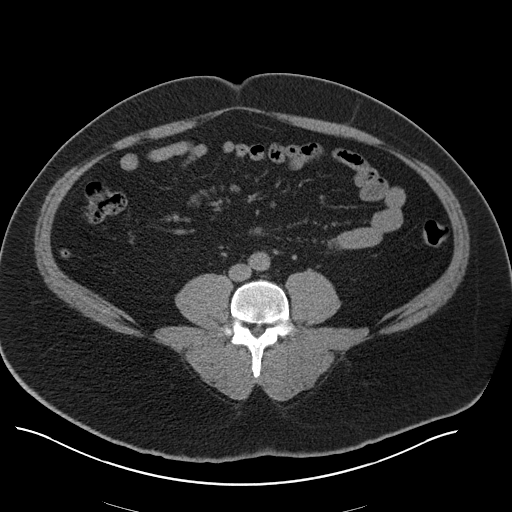
[im 53/99  soft-tissue]
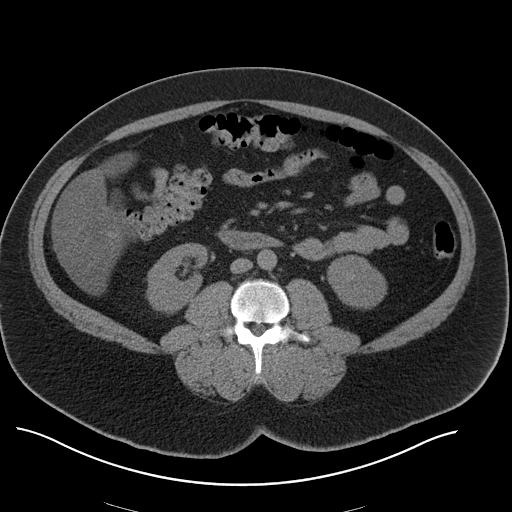
[im 59/99  soft-tissue]
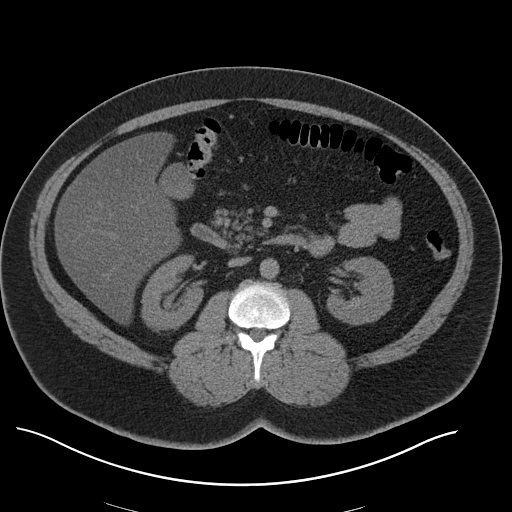
[im 59/99  bone]
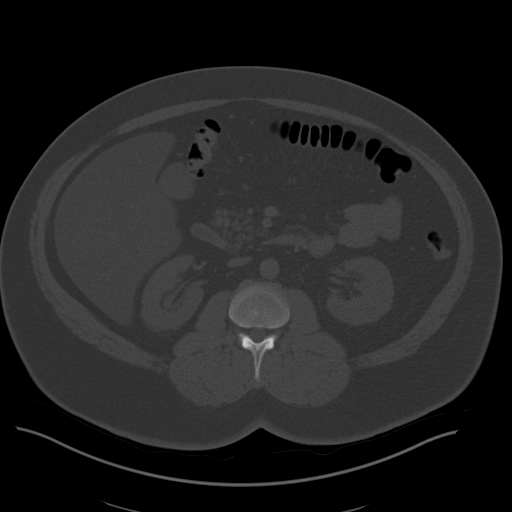
[im 66/99  soft-tissue]
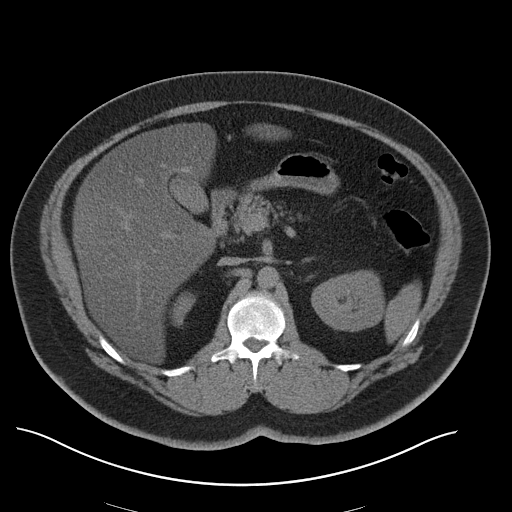
[im 72/99  soft-tissue]
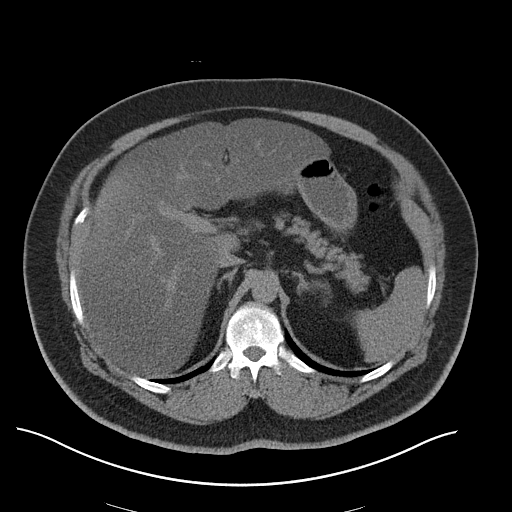
[im 79/99  soft-tissue]
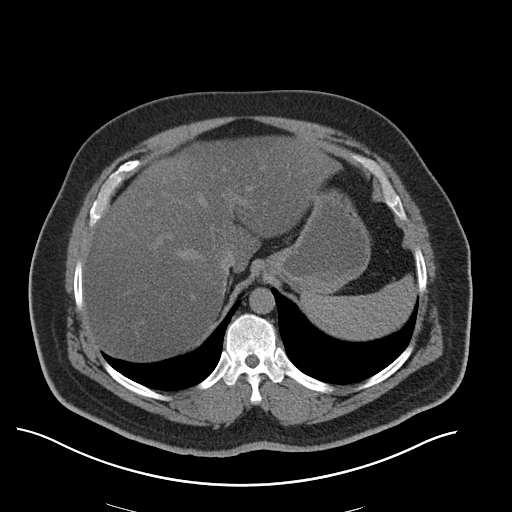
[im 85/99  soft-tissue]
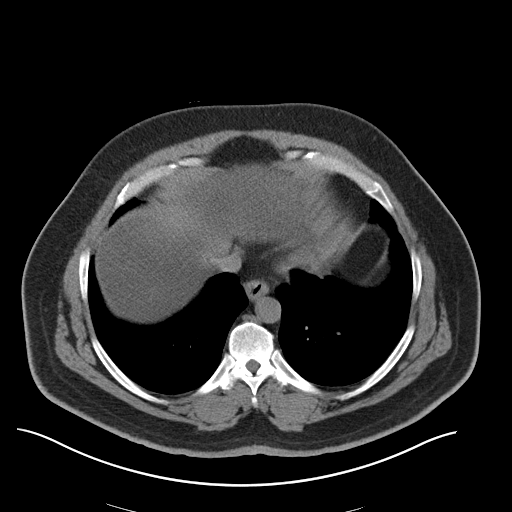
[im 92/99  soft-tissue]
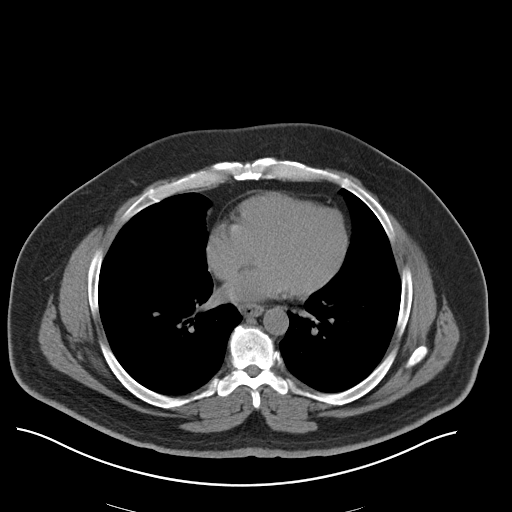

[Series 5: coronal · coronal · 1.00mm/px · 3 of 149 slices shown]
[im 50/149  soft-tissue]
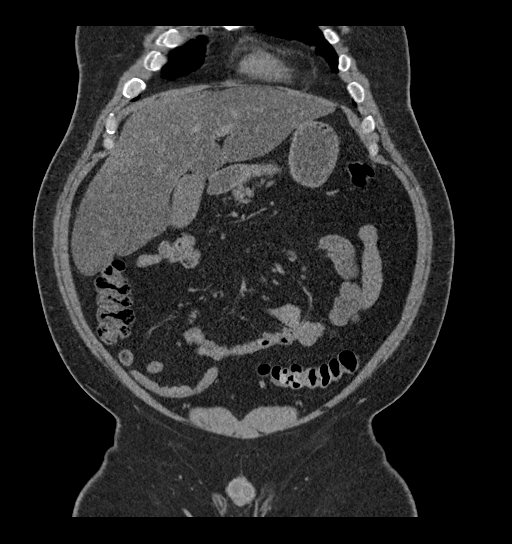
[im 66/149  soft-tissue]
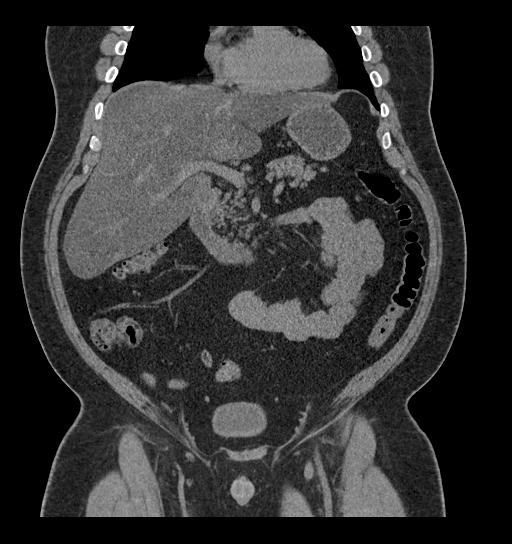
[im 83/149  soft-tissue]
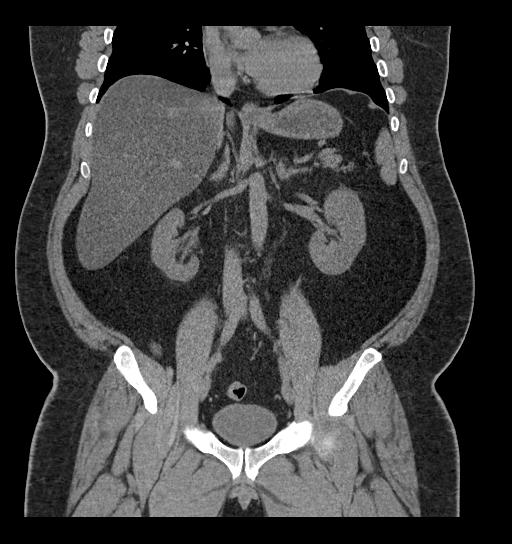

[17 of 46 positions shown; findings below may reference images not displayed]

FINDINGS: Lower chest: Normal

Hepatobiliary: Diffuse fatty change of the liver. No focal lesion.
No calcified gallstones.

Pancreas: Normal

Spleen: Normal

Adrenals/Urinary Tract: Adrenal glands are normal. Right kidney is
normal. No cyst, mass, stone or hydronephrosis. Left kidney shows a
4 mm nonobstructing stone in the upper pole. No hydronephrosis. No
passing stone. No stone in the bladder.

Stomach/Bowel: Stomach and small intestine are normal. Normal
appendix. No colon abnormality.

Vascular/Lymphatic: Aorta and IVC are normal.  No adenopathy.

Reproductive: Normal

Other: No free fluid or air.

Musculoskeletal: Ordinary mild lower lumbar degenerative changes.
IMPRESSION: 4 mm nonobstructing stone in the upper pole of the left kidney. No
hydronephrosis or passing stone. No stone in the bladder.

Diffuse steatosis of the liver as seen previously. No focal or acute
finding.

## 2024-01-28 ENCOUNTER — Ambulatory Visit: Payer: Self-pay

## 2024-01-28 NOTE — Telephone Encounter (Signed)
 FYI Only or Action Required?: FYI only for provider: appointment scheduled on 01/29/2024 at 12:05 PM.  Patient was last seen in primary care on 01/11/2022 by Joesph Annabella HERO, FNP.  Called Nurse Triage reporting Rectal Bleeding.  Symptoms began several days ago.  Interventions attempted: Rest, hydration, or home remedies.  Symptoms are: unchanged.  Triage Disposition: See Physician Within 24 Hours  Patient/caregiver understands and will follow disposition?: Yes  Copied from CRM #[id number]. Topic: Clinical - Red Word Triage >> Jan 28, 2024  9:06 AM Farrel B wrote: [facility] that prompted transfer to Nurse Triage: blood in stool for the past three days, extreme bloating. Reason for Disposition  MODERATE rectal bleeding (e.g., small blood clots, passing blood without stool, or toilet water turns red)  Answer Assessment - Initial Assessment Questions Patient reports blood in stool for the past three days. Reports having a bloody bowel movement daily for the past three days. Endorses abdominal bloating but no pain, nausea, or vomiting.   1. APPEARANCE of BLOOD: What color is it? Is it passed separately, on the surface of the stool, or mixed in with the stool?      Patient reports blood has been mixed in with the stool 2. AMOUNT: How much blood was passed?      Patient reports when he wipes there is blood. Unable to say if toilet water changed colors.  3. FREQUENCY: How many times has blood been passed with the stools?      Last three days once a day 4. ONSET: When was the blood first seen in the stools? (Days or weeks)      Three days ago 5. DIARRHEA: Is there also some diarrhea? If Yes, ask: How many diarrhea stools in the past 24 hours?      no 6. CONSTIPATION: Do you have constipation? If Yes, ask: How bad is it?     no 7. RECURRENT SYMPTOMS: Have you had blood in your stools before? If Yes, ask: When was the last time? and What happened that time?       no 8. BLOOD THINNERS: Do you take any blood thinners? (e.g., aspirin, clopidogrel / Plavix, coumadin, heparin). Notes: Other strong blood thinners include: Arixtra (fondaparinux), Eliquis (apixaban), Pradaxa (dabigatran), and Xarelto (rivaroxaban).     no 9. OTHER SYMPTOMS: Do you have any other symptoms?  (e.g., abdomen pain, vomiting, dizziness, fever)     Abdominal bloating  Protocols used: Rectal Bleeding-A-AH

## 2024-01-28 NOTE — Telephone Encounter (Signed)
 Spoke with pt and he states there is bright red blood when he wipes as well as in his stool. He is also experiencing bloating even after drinking water that does not go away for hours even after bowel movement. Offered appointment for today but pt is not able to come in today. Advised pt if any new symptoms develop or if they get worse to go to ED. Pt verbalized understanding

## 2024-01-29 ENCOUNTER — Ambulatory Visit: Admitting: Family Medicine

## 2024-01-29 ENCOUNTER — Encounter: Payer: Self-pay | Admitting: Family Medicine

## 2024-01-29 VITALS — BP 118/73 | HR 63 | Temp 97.8°F | Ht 66.0 in | Wt 242.6 lb

## 2024-01-29 DIAGNOSIS — Z800 Family history of malignant neoplasm of digestive organs: Secondary | ICD-10-CM | POA: Diagnosis not present

## 2024-01-29 DIAGNOSIS — K921 Melena: Secondary | ICD-10-CM

## 2024-01-29 DIAGNOSIS — K625 Hemorrhage of anus and rectum: Secondary | ICD-10-CM | POA: Diagnosis not present

## 2024-01-29 DIAGNOSIS — Z6839 Body mass index (BMI) 39.0-39.9, adult: Secondary | ICD-10-CM | POA: Diagnosis not present

## 2024-01-29 DIAGNOSIS — E1165 Type 2 diabetes mellitus with hyperglycemia: Secondary | ICD-10-CM | POA: Diagnosis not present

## 2024-01-29 DIAGNOSIS — R140 Abdominal distension (gaseous): Secondary | ICD-10-CM

## 2024-01-29 NOTE — Progress Notes (Signed)
 Acute Office Visit  Subjective:     Patient ID: Ronald Palmer, male    DOB: [birth year], 48 y.o.   MRN: [id number]  Chief Complaint  Patient presents with   Rectal Bleeding    HPI  History of Present Illness   Ronald Palmer is a 48 year old male who presents with rectal bleeding and bloating.  Rectal bleeding - Bright red blood present on toilet paper and in stool since Sunday - Bleeding is intermittent - No associated rectal pain or itching - Regular daily bowel movements - No constipation - No prior colonoscopy or Cologuard testing - Family history of colon cancer - maternal uncle x2  Abdominal bloating and early satiety - Bloating and sensation of fullness after eating small amounts for the past few days - No nausea, vomiting, or abdominal pain - No unintentional weight loss  Constitutional and gastrointestinal symptoms - No dizziness or lightheadedness - No trouble swallowing - No abdominal pain - No fever     He reports following up with endocrinology in [place] for DM.   ROS As per HPI.      Objective:    BP 118/73   Pulse 63   Temp 97.8 F (36.6 C) (Temporal)   Ht 5' 6 (1.676 m)   Wt 242 lb 9.6 oz (110 kg)   SpO2 97%   BMI 39.16 kg/m  Wt Readings from Last 3 Encounters:  01/29/24 242 lb 9.6 oz (110 kg)  02/07/22 246 lb 12.8 oz (111.9 kg)  01/11/22 257 lb 2 oz (116.6 kg)      Physical Exam Vitals and nursing note reviewed.  Constitutional:      General: He is not in acute distress.    Appearance: He is obese. He is not ill-appearing, toxic-appearing or diaphoretic.  Cardiovascular:     Rate and Rhythm: Normal rate and regular rhythm.     Pulses: Normal pulses.     Heart sounds: Normal heart sounds. No murmur heard. Pulmonary:     Effort: Pulmonary effort is normal. No respiratory distress.     Breath sounds: Normal breath sounds.  Abdominal:     General: Bowel sounds are normal. There is no distension.     Palpations: Abdomen is  soft. There is no mass.     Tenderness: There is no abdominal tenderness. There is no guarding or rebound.  Musculoskeletal:     Cervical back: Neck supple. No tenderness.     Right lower leg: No edema.     Left lower leg: No edema.  Lymphadenopathy:     Cervical: No cervical adenopathy.  Skin:    General: Skin is warm and dry.  Neurological:     General: No focal deficit present.     Mental Status: He is alert and oriented to person, place, and time.  Psychiatric:        Mood and Affect: Mood normal.        Behavior: Behavior normal.     No results found for any visits on 01/29/24.      Assessment & Plan:   Ronald Palmer was seen today for rectal bleeding.  Diagnoses and all orders for this visit:  Blood in stool -     Ambulatory referral to Gastroenterology -     Fecal occult blood, imunochemical; Future  Rectal bleeding -     Ambulatory referral to Gastroenterology -     Fecal occult blood, imunochemical; Future  Abdominal bloating  Family history of  colon cancer -     Ambulatory referral to Gastroenterology  Uncontrolled type 2 diabetes mellitus with hyperglycemia (HCC)  Morbid obesity (HCC)   Assessment and Plan    Rectal bleeding Blood in stool Abdominal bloating Family hx of colon cancer - Referred to gastroenterology for evaluation and possible colonoscopy. - Ordered fecal occult blood test (FOBT).     T2DM Continue to follow up with endocrinology for management.   Morbid obesity BMI 39 with DM.  - Reports weight loss efforts - diet, exercise, weight loss encourage.   Return to office for new or worsening symptoms, or if symptoms persist.   Scheduled CPE  Annabella CHRISTELLA Search, FNP

## 2024-01-30 ENCOUNTER — Encounter (INDEPENDENT_AMBULATORY_CARE_PROVIDER_SITE_OTHER): Payer: Self-pay | Admitting: *Deleted

## 2024-03-03 ENCOUNTER — Ambulatory Visit (INDEPENDENT_AMBULATORY_CARE_PROVIDER_SITE_OTHER): Admitting: Gastroenterology
